# Patient Record
Sex: Female | Born: 1960 | Race: Black or African American | Hispanic: No | Marital: Married | Smoking: Never smoker
Health system: Southern US, Community
[De-identification: ages and names within clinical notes are randomized; demographics above are authoritative.]

## PROBLEM LIST (undated history)

## (undated) DIAGNOSIS — K319 Disease of stomach and duodenum, unspecified: Secondary | ICD-10-CM

---

## 2009-05-18 ENCOUNTER — Emergency Department (HOSPITAL_COMMUNITY): Admission: EM | Admit: 2009-05-18 | Discharge: 2009-05-18 | Payer: Self-pay | Admitting: Family Medicine

## 2009-05-27 ENCOUNTER — Emergency Department (HOSPITAL_COMMUNITY): Admission: EM | Admit: 2009-05-27 | Discharge: 2009-05-28 | Payer: Self-pay | Admitting: Emergency Medicine

## 2009-06-01 ENCOUNTER — Ambulatory Visit: Payer: Self-pay | Admitting: Obstetrics and Gynecology

## 2009-07-04 ENCOUNTER — Ambulatory Visit (HOSPITAL_COMMUNITY): Admission: RE | Admit: 2009-07-04 | Discharge: 2009-07-04 | Payer: Self-pay | Admitting: Family Medicine

## 2009-07-29 ENCOUNTER — Encounter: Admission: RE | Admit: 2009-07-29 | Discharge: 2009-07-29 | Payer: Self-pay | Admitting: Internal Medicine

## 2009-08-04 ENCOUNTER — Ambulatory Visit: Payer: Self-pay | Admitting: Obstetrics and Gynecology

## 2009-08-05 ENCOUNTER — Ambulatory Visit (HOSPITAL_COMMUNITY): Admission: RE | Admit: 2009-08-05 | Discharge: 2009-08-05 | Payer: Self-pay | Admitting: Obstetrics & Gynecology

## 2009-08-18 ENCOUNTER — Ambulatory Visit: Payer: Self-pay | Admitting: Obstetrics and Gynecology

## 2010-03-20 ENCOUNTER — Emergency Department (HOSPITAL_COMMUNITY): Admission: EM | Admit: 2010-03-20 | Discharge: 2010-03-20 | Payer: Self-pay | Admitting: Emergency Medicine

## 2010-07-11 LAB — DIFFERENTIAL
Basophils Absolute: 0 10*3/uL (ref 0.0–0.1)
Eosinophils Relative: 5 % (ref 0–5)
Lymphocytes Relative: 50 % — ABNORMAL HIGH (ref 12–46)
Lymphs Abs: 1.7 10*3/uL (ref 0.7–4.0)
Monocytes Absolute: 0.2 10*3/uL (ref 0.1–1.0)

## 2010-07-11 LAB — CBC
Hemoglobin: 14 g/dL (ref 12.0–15.0)
MCH: 30.7 pg (ref 26.0–34.0)
MCV: 90.8 fL (ref 78.0–100.0)
RBC: 4.56 MIL/uL (ref 3.87–5.11)
RDW: 13.6 % (ref 11.5–15.5)

## 2010-07-11 LAB — POCT I-STAT, CHEM 8
BUN: 9 mg/dL (ref 6–23)
Calcium, Ion: 1.2 mmol/L (ref 1.12–1.32)
Creatinine, Ser: 0.9 mg/dL (ref 0.4–1.2)
TCO2: 27 mmol/L (ref 0–100)

## 2010-07-11 LAB — POCT CARDIAC MARKERS
CKMB, poc: <1 ng/mL — ABNORMAL LOW (ref 1.0–8.0)
Troponin i, poc: <0.05 ng/mL (ref 0.00–0.09)

## 2010-07-11 LAB — BASIC METABOLIC PANEL
Calcium: 9.1 mg/dL (ref 8.4–10.5)
Creatinine, Ser: 0.77 mg/dL (ref 0.4–1.2)
GFR calc Af Amer: >60 mL/min (ref >60)
GFR calc non Af Amer: >60 mL/min (ref >60)
Glucose, Bld: 93 mg/dL (ref 70–99)
Sodium: 141 mEq/L (ref 135–145)

## 2010-07-11 LAB — D-DIMER, QUANTITATIVE: D-Dimer, Quant: 0.35 ug/mL-FEU (ref 0.00–0.48)

## 2010-07-11 IMAGING — US US TRANSVAGINAL NON-OB
1 series · 14 of 25 positions shown · non-contrast
Comparison: 07/04/2009

CLINICAL DATA: Severe pelvic pain.  History of ovarian cysts.

TRANSVAGINAL ULTRASOUND OF PELVIS
TECHNIQUE: Transvaginal ultrasound examination of the pelvis was
performed including evaluation of the uterus, ovaries, adnexal
regions, and pelvic cul-de-sac.

[Series 1: us transvaginal non-ob · 0.11mm/px · 14 of 31 slices shown]
[im 1/31]
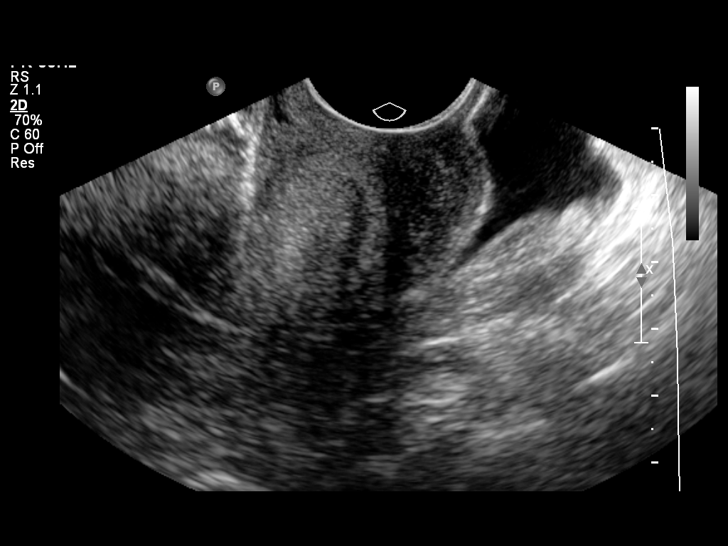
[im 3/31]
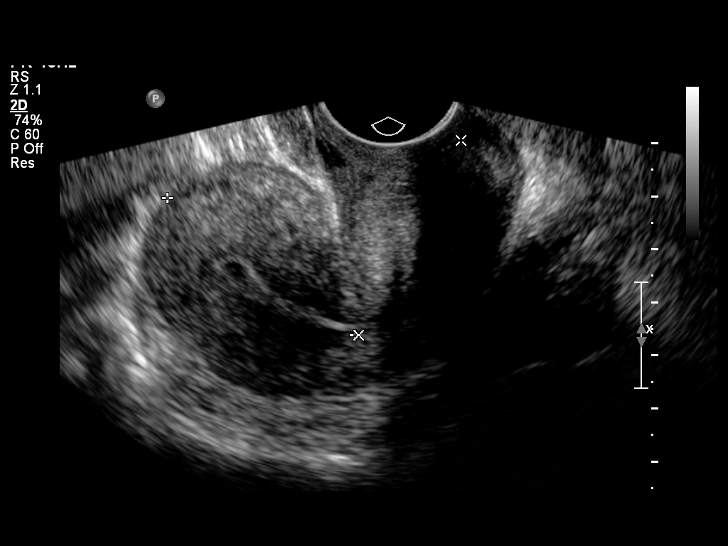
[im 6/31]
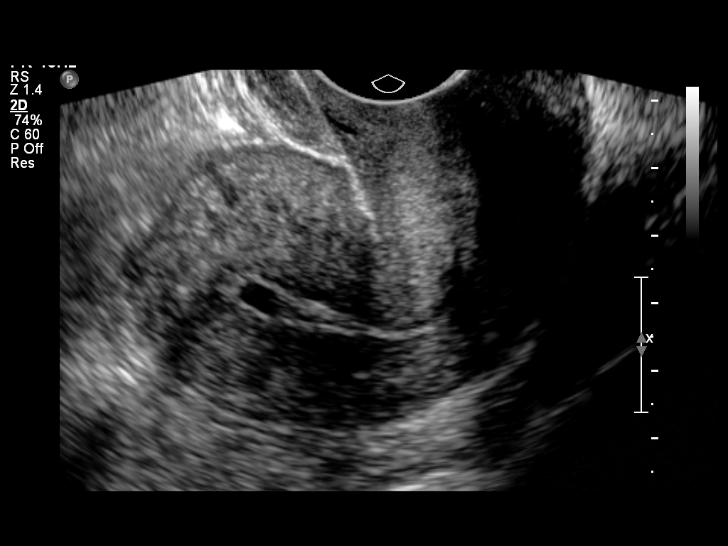
[im 8/31]
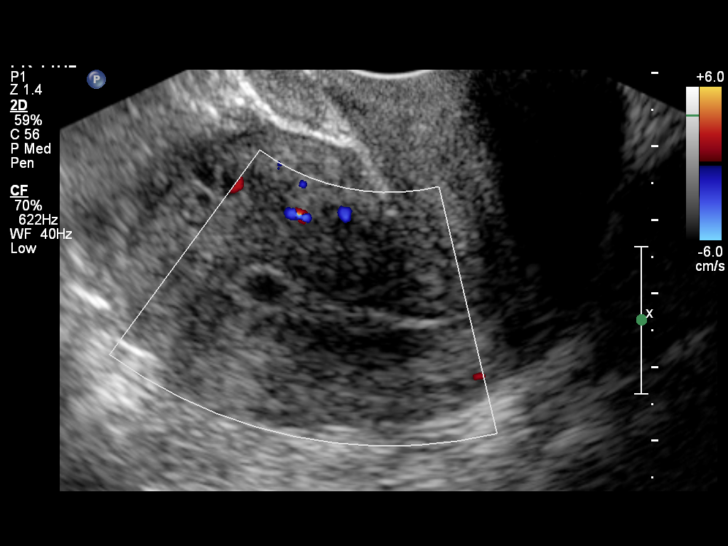
[im 11/31]
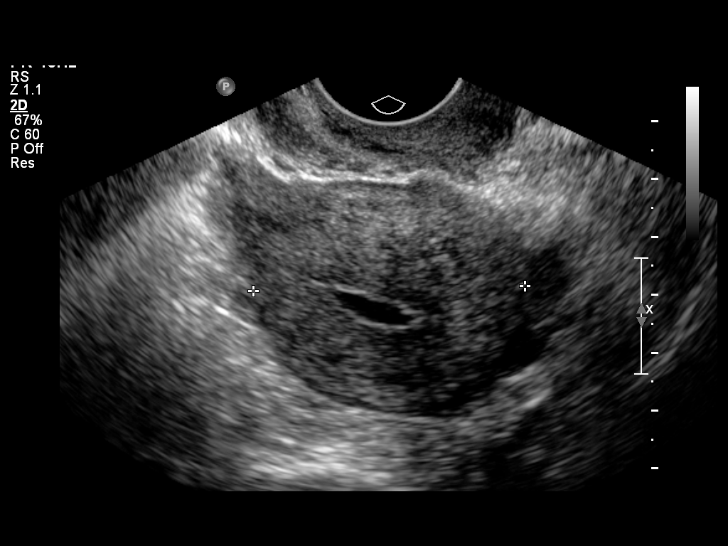
[im 12/31]
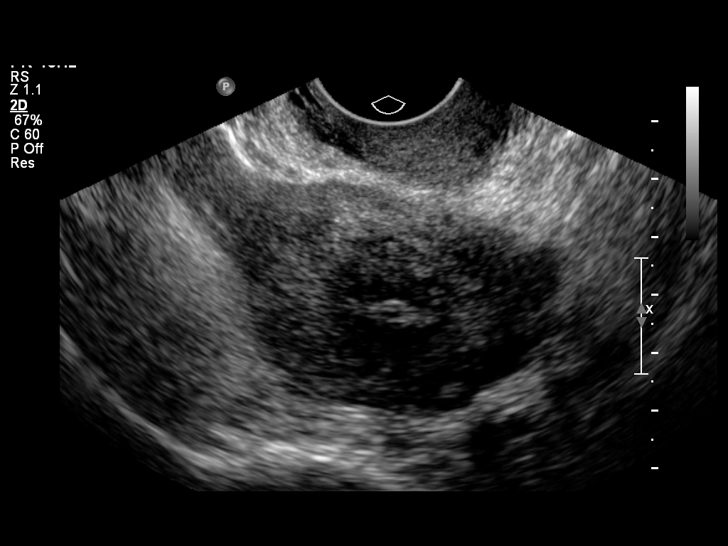
[im 14/31]
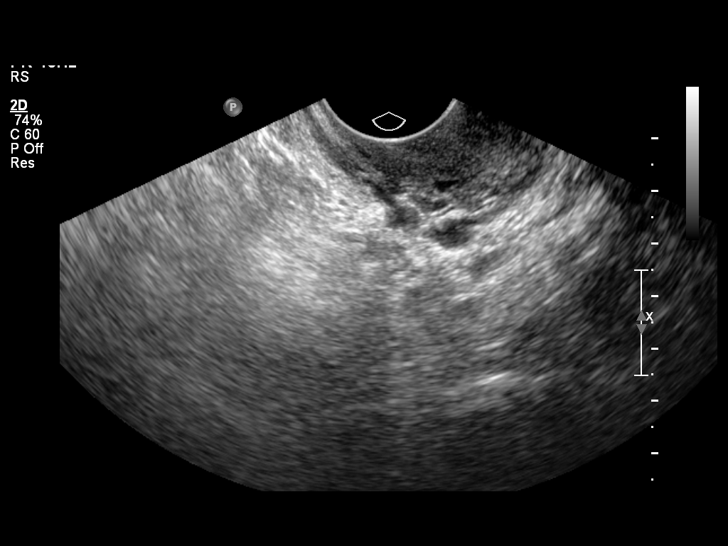
[im 17/31]
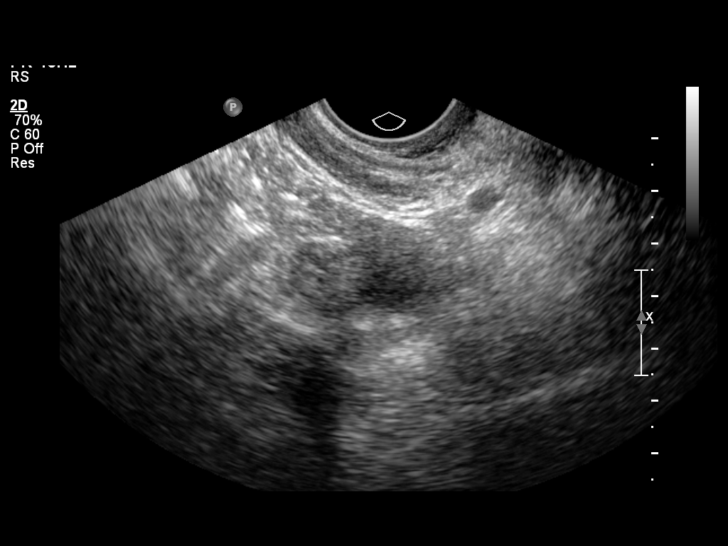
[im 19/31]
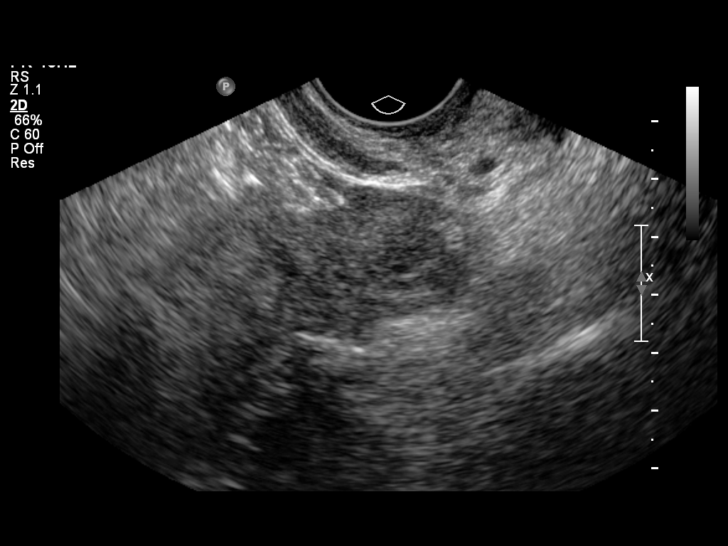
[im 21/31]
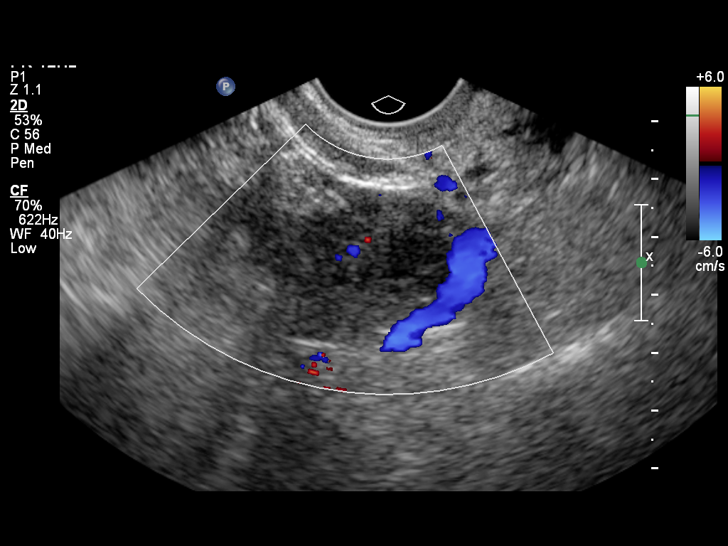
[im 23/31]
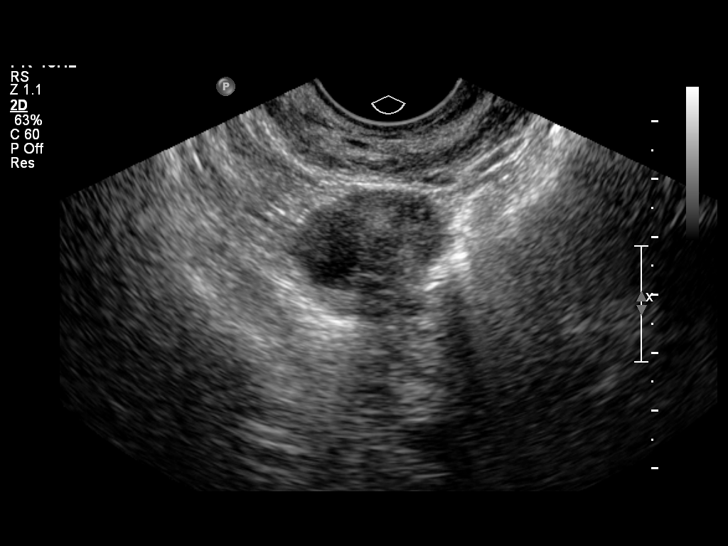
[im 26/31]
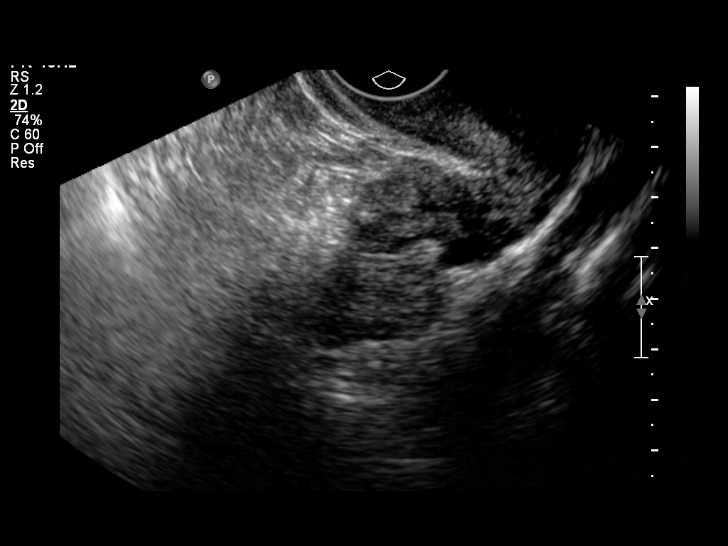
[im 28/31]
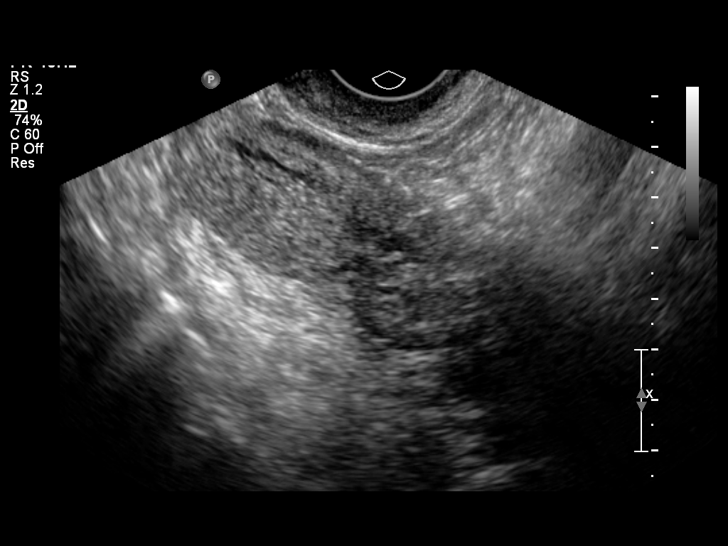
[im 31/31]
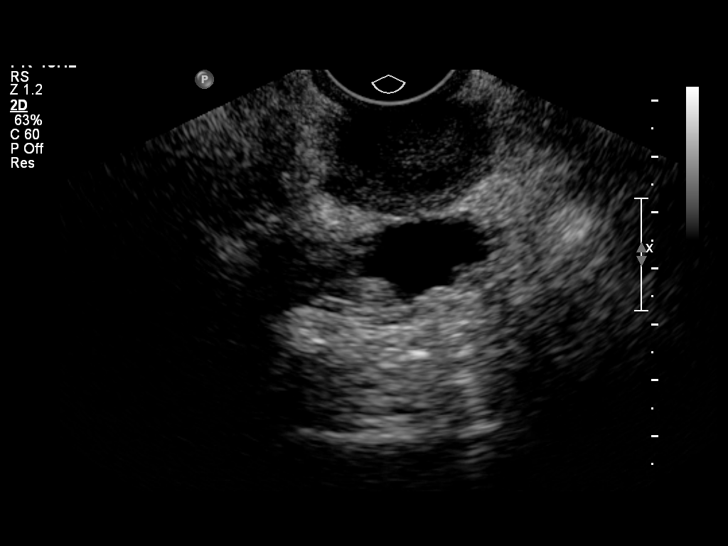

[14 of 25 positions shown; findings below may reference images not displayed]

FINDINGS: Uterus measures 8.5 x 3.8 x 4.7 cm.  No fibroids or other uterine
masses identified.

Endometrium measures 2 mm in thickness.  Tiny amount of fluid noted
in endometrial cavity.

Right Ovary measures 3.3 x 2.2 x 1.6 cm.  Normal appearance.

Left Ovary measures 2.7 x 1.5 x 1.2 cm.  Normal appearance.

Other Findings:  Tiny amount of free fluid in pelvic cul-de-sac.
IMPRESSION: No evidence of pelvic mass or other significant abnormality. Tiny
amount of free fluid in pelvic cul-de-sac is nonspecific, but
likely physiologic.

## 2010-07-17 LAB — POCT I-STAT, CHEM 8
BUN: 13 mg/dL (ref 6–23)
Creatinine, Ser: 0.8 mg/dL (ref 0.4–1.2)
Hemoglobin: 14.3 g/dL (ref 12.0–15.0)
Potassium: 3.5 mEq/L (ref 3.5–5.1)
Sodium: 141 mEq/L (ref 135–145)

## 2010-07-17 LAB — URINALYSIS, ROUTINE W REFLEX MICROSCOPIC
Bilirubin Urine: NEGATIVE
Ketones, ur: NEGATIVE mg/dL
Nitrite: NEGATIVE
Protein, ur: NEGATIVE mg/dL
Urobilinogen, UA: 0.2 mg/dL (ref 0.0–1.0)

## 2010-07-19 LAB — POCT URINALYSIS DIP (DEVICE)
Bilirubin Urine: NEGATIVE
Ketones, ur: 15 mg/dL — AB
pH: 5 (ref 5.0–8.0)

## 2010-07-20 LAB — POCT URINALYSIS DIP (DEVICE)
Ketones, ur: NEGATIVE mg/dL
Protein, ur: NEGATIVE mg/dL
Specific Gravity, Urine: <=1.005 (ref 1.005–1.030)

## 2011-11-02 ENCOUNTER — Other Ambulatory Visit: Payer: Self-pay | Admitting: Internal Medicine

## 2011-11-02 DIAGNOSIS — N6452 Nipple discharge: Secondary | ICD-10-CM

## 2011-11-07 ENCOUNTER — Other Ambulatory Visit: Payer: Self-pay | Admitting: Internal Medicine

## 2011-11-07 ENCOUNTER — Ambulatory Visit
Admission: RE | Admit: 2011-11-07 | Discharge: 2011-11-07 | Disposition: A | Discharge status: Home / Self Care | Payer: Self-pay | Source: Ambulatory Visit | Attending: Internal Medicine | Admitting: Internal Medicine

## 2011-11-07 ENCOUNTER — Ambulatory Visit
Admission: RE | Admit: 2011-11-07 | Discharge: 2011-11-07 | Disposition: A | Discharge status: Home / Self Care | Payer: No Typology Code available for payment source | Source: Ambulatory Visit | Attending: Internal Medicine | Admitting: Internal Medicine

## 2011-11-07 DIAGNOSIS — N6452 Nipple discharge: Secondary | ICD-10-CM

## 2011-11-10 ENCOUNTER — Emergency Department (HOSPITAL_COMMUNITY)
Admission: EM | Admit: 2011-11-10 | Discharge: 2011-11-10 | Disposition: A | Discharge status: Home / Self Care | Payer: Medicaid Other | Attending: Emergency Medicine | Admitting: Emergency Medicine

## 2011-11-10 ENCOUNTER — Emergency Department (HOSPITAL_COMMUNITY): Payer: Medicaid Other

## 2011-11-10 ENCOUNTER — Encounter (HOSPITAL_COMMUNITY): Payer: Self-pay | Admitting: *Deleted

## 2011-11-10 DIAGNOSIS — R110 Nausea: Secondary | ICD-10-CM | POA: Insufficient documentation

## 2011-11-10 DIAGNOSIS — K2990 Gastroduodenitis, unspecified, without bleeding: Secondary | ICD-10-CM | POA: Insufficient documentation

## 2011-11-10 DIAGNOSIS — R079 Chest pain, unspecified: Secondary | ICD-10-CM | POA: Insufficient documentation

## 2011-11-10 DIAGNOSIS — K2970 Gastritis, unspecified, without bleeding: Secondary | ICD-10-CM | POA: Insufficient documentation

## 2011-11-10 DIAGNOSIS — R109 Unspecified abdominal pain: Secondary | ICD-10-CM

## 2011-11-10 LAB — CBC WITH DIFFERENTIAL/PLATELET
Basophils Absolute: 0 10*3/uL (ref 0.0–0.1)
Basophils Relative: 1 % (ref 0–1)
Eosinophils Relative: 8 % — ABNORMAL HIGH (ref 0–5)
Lymphocytes Relative: 39 % (ref 12–46)
MCHC: 34.4 g/dL (ref 30.0–36.0)
MCV: 90.2 fL (ref 78.0–100.0)
Platelets: 112 10*3/uL — ABNORMAL LOW (ref 150–400)
RDW: 12.8 % (ref 11.5–15.5)
WBC: 3.8 10*3/uL — ABNORMAL LOW (ref 4.0–10.5)

## 2011-11-10 LAB — URINALYSIS, ROUTINE W REFLEX MICROSCOPIC
Glucose, UA: NEGATIVE mg/dL
Leukocytes, UA: NEGATIVE
Specific Gravity, Urine: 1.006 (ref 1.005–1.030)
pH: 6 (ref 5.0–8.0)

## 2011-11-10 LAB — COMPREHENSIVE METABOLIC PANEL
ALT: 7 U/L (ref 0–35)
AST: 13 U/L (ref 0–37)
Albumin: 3.4 g/dL — ABNORMAL LOW (ref 3.5–5.2)
CO2: 23 mEq/L (ref 19–32)
Calcium: 8.8 mg/dL (ref 8.4–10.5)
GFR calc non Af Amer: >90 mL/min (ref >90)
Sodium: 138 mEq/L (ref 135–145)
Total Protein: 6.6 g/dL (ref 6.0–8.3)

## 2011-11-10 LAB — POCT PREGNANCY, URINE: Preg Test, Ur: NEGATIVE

## 2011-11-10 LAB — URINE MICROSCOPIC-ADD ON

## 2011-11-10 LAB — PREGNANCY, URINE: Preg Test, Ur: NEGATIVE

## 2011-11-10 MED ORDER — IOHEXOL 300 MG/ML  SOLN: 20.0000 mL | INTRAMUSCULAR | Status: AC

## 2011-11-10 MED ORDER — IOHEXOL 300 MG/ML  SOLN
100.0000 mL | Freq: Once | INTRAMUSCULAR | Status: AC | PRN
  Administered 2011-11-10: 100 mL via INTRAVENOUS

## 2011-11-10 MED ORDER — HYDROCODONE-ACETAMINOPHEN 5-500 MG PO TABS: 1.0000 | ORAL_TABLET | Freq: Four times a day (QID) | ORAL | Status: AC | PRN

## 2011-11-10 MED ORDER — OMEPRAZOLE 20 MG PO CPDR: 20.0000 mg | DELAYED_RELEASE_CAPSULE | Freq: Two times a day (BID) | ORAL | Status: DC

## 2011-11-10 MED ORDER — SODIUM CHLORIDE 0.9 % IV SOLN
Freq: Once | INTRAVENOUS | Status: AC
  Administered 2011-11-10: 09:00:00 via INTRAVENOUS

## 2011-11-10 NOTE — ED Notes (Signed)
To ED for eval of lower abd pain that radiates around to back and up torso. Pain started yesterday. Pt vomits after eating. Skin w/d, resp e/u. abd soft. No diarrhea.

## 2011-11-10 NOTE — ED Provider Notes (Signed)
History     CSN: [id number]  Arrival date & time 11/10/11  7829   First MD Initiated Contact with Patient 11/10/11 [phone]      Chief Complaint  Patient presents with  . Abdominal Pain    (Consider location/radiation/quality/duration/timing/severity/associated sxs/prior treatment) HPI Comments: Patient speaks minimal English.  Native language is [place] Swahili and history is taken through son and husband who have acted as Equities trader.  A Level 5 caveat therefore applies.  She reports to me she has had two days of discomfort that started in the lower abdomen and has spread to the upper abd and chest.  She was recently treated with antibiotics for a discharge from her breast.  She had an ultrasound at [facility]' that was okay.    Patient is a 51 y.o. female presenting with abdominal pain. The history is provided by the patient.  Abdominal Pain The primary symptoms of the illness include abdominal pain and nausea. The primary symptoms of the illness do not include fever, shortness of breath, vomiting, diarrhea or dysuria. The current episode started 2 days ago. The onset of the illness was gradual. The problem has been gradually worsening.  The illness is associated with eating. The patient states that she believes she is currently not pregnant. The patient has not had a change in bowel habit. Symptoms associated with the illness do not include chills, constipation or frequency.    History reviewed. No pertinent past medical history.  History reviewed. No pertinent past surgical history.  History reviewed. No pertinent family history.  History  Substance Use Topics  . Smoking status: Not on file  . Smokeless tobacco: Not on file  . Alcohol Use: Not on file    OB History    Grav Para Term Preterm Abortions TAB SAB Ect Mult Living                  Review of Systems  Constitutional: Negative for fever and chills.  Respiratory: Negative for shortness of breath.   Gastrointestinal:  Positive for nausea and abdominal pain. Negative for vomiting, diarrhea and constipation.  Genitourinary: Negative for dysuria and frequency.  All other systems reviewed and are negative.    Allergies  Review of patient's allergies indicates no known allergies.  Home Medications  No current outpatient prescriptions on file.  BP 126/83  Pulse 72  Temp 98.3 F (36.8 C) (Oral)  Resp 20  SpO2 98%  Physical Exam  Nursing note and vitals reviewed. Constitutional: She is oriented to person, place, and time. She appears well-developed and well-nourished. No distress.  HENT:  Head: Normocephalic and atraumatic.  Neck: Normal range of motion. Neck supple.  Cardiovascular: Normal rate and regular rhythm.  Exam reveals no gallop and no friction rub.   No murmur heard. Pulmonary/Chest: Effort normal and breath sounds normal. No respiratory distress. She has no wheezes.  Abdominal: Soft. Bowel sounds are normal. She exhibits no distension.       There is mild ttp in all four quadrants, but no rebound or guarding.  The worst seems to be in the ruq and epigastrium.    Genitourinary:       There is no cva ttp.  Musculoskeletal: Normal range of motion.  Neurological: She is alert and oriented to person, place, and time.  Skin: Skin is warm and dry. She is not diaphoretic.    ED Course  Procedures (including critical care time)   Labs Reviewed  CBC WITH DIFFERENTIAL  COMPREHENSIVE METABOLIC PANEL  LIPASE, BLOOD  URINALYSIS, ROUTINE W REFLEX MICROSCOPIC  PREGNANCY, URINE   No results found.   No diagnosis found.    MDM  The results of the workup show a normal wbc, electrolytes, and urine.  A ct was obtained which reveals only stomach wall thickening, consistent with gastritis.  This is consistent with the location and nature of her symptoms, and nothing else has turned up on the ct to explain.  She will be discharged to home with prilosec and pain medications.  To return or follow  up prn.            Geoffery Lyons, MD 11/10/11 1215

## 2011-12-06 ENCOUNTER — Ambulatory Visit (INDEPENDENT_AMBULATORY_CARE_PROVIDER_SITE_OTHER): Payer: Self-pay | Admitting: Surgery

## 2011-12-24 ENCOUNTER — Encounter (INDEPENDENT_AMBULATORY_CARE_PROVIDER_SITE_OTHER): Payer: Self-pay | Admitting: Surgery

## 2011-12-24 ENCOUNTER — Ambulatory Visit (INDEPENDENT_AMBULATORY_CARE_PROVIDER_SITE_OTHER): Payer: Self-pay | Admitting: Surgery

## 2011-12-24 VITALS — BP 130/80 | HR 62 | Temp 97.8°F | Ht 65.0 in | Wt 173.0 lb

## 2011-12-24 DIAGNOSIS — N6452 Nipple discharge: Secondary | ICD-10-CM | POA: Insufficient documentation

## 2011-12-24 DIAGNOSIS — N6459 Other signs and symptoms in breast: Secondary | ICD-10-CM

## 2011-12-24 NOTE — Progress Notes (Signed)
Subjective:     Patient ID: Nicole Whitney, female   DOB: 1960-06-18, 51 y.o.   MRN: [id number]  HPI She is referred by Dr. Si Gaul for right nipple discharge. She had a recent ductogram demonstrating possible papillomas in 2 separate areas. She does not take any English and is a copy by her husband who is translating. She has no previous history of breast surgery. She is otherwise healthy  Review of Systems     Objective:   Physical Exam    On physical examination, there are no palpable masses in either breast. There is no axillary adenopathy. I cannot demonstrate any nipple discharge today.  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 I discussed this with the patient through her husband. I discussed the risks of surgery which includes but is not limited to bleeding, infection, need for further surgery should malignancy be present, et Karie Soda. She wishes to proceed

## 2012-01-15 ENCOUNTER — Ambulatory Visit (HOSPITAL_COMMUNITY): Admission: RE | Admit: 2012-01-15 | Payer: Self-pay | Source: Ambulatory Visit | Admitting: Surgery

## 2012-01-15 ENCOUNTER — Encounter (HOSPITAL_COMMUNITY): Admission: RE | Payer: Self-pay | Source: Ambulatory Visit

## 2012-01-15 SURGERY — EXCISION OF BREAST BIOPSY
Anesthesia: General | Laterality: Right

## 2012-01-29 ENCOUNTER — Encounter (INDEPENDENT_AMBULATORY_CARE_PROVIDER_SITE_OTHER): Payer: Self-pay | Admitting: Surgery

## 2012-01-31 ENCOUNTER — Encounter (INDEPENDENT_AMBULATORY_CARE_PROVIDER_SITE_OTHER): Payer: Self-pay | Admitting: Surgery

## 2013-02-12 ENCOUNTER — Emergency Department (HOSPITAL_COMMUNITY): Payer: Worker's Compensation

## 2013-02-12 ENCOUNTER — Emergency Department (HOSPITAL_COMMUNITY)
Admission: EM | Admit: 2013-02-12 | Discharge: 2013-02-12 | Disposition: A | Discharge status: Home / Self Care | Payer: Worker's Compensation | Attending: Emergency Medicine | Admitting: Emergency Medicine

## 2013-02-12 ENCOUNTER — Encounter (HOSPITAL_COMMUNITY): Payer: Self-pay | Admitting: Emergency Medicine

## 2013-02-12 DIAGNOSIS — S8001XA Contusion of right knee, initial encounter: Secondary | ICD-10-CM

## 2013-02-12 DIAGNOSIS — S161XXA Strain of muscle, fascia and tendon at neck level, initial encounter: Secondary | ICD-10-CM

## 2013-02-12 DIAGNOSIS — Y939 Activity, unspecified: Secondary | ICD-10-CM | POA: Insufficient documentation

## 2013-02-12 DIAGNOSIS — Y990 Civilian activity done for income or pay: Secondary | ICD-10-CM | POA: Insufficient documentation

## 2013-02-12 DIAGNOSIS — S0093XA Contusion of unspecified part of head, initial encounter: Secondary | ICD-10-CM

## 2013-02-12 DIAGNOSIS — S139XXA Sprain of joints and ligaments of unspecified parts of neck, initial encounter: Secondary | ICD-10-CM | POA: Insufficient documentation

## 2013-02-12 DIAGNOSIS — S298XXA Other specified injuries of thorax, initial encounter: Secondary | ICD-10-CM | POA: Insufficient documentation

## 2013-02-12 DIAGNOSIS — R110 Nausea: Secondary | ICD-10-CM | POA: Insufficient documentation

## 2013-02-12 DIAGNOSIS — S8000XA Contusion of unspecified knee, initial encounter: Secondary | ICD-10-CM | POA: Insufficient documentation

## 2013-02-12 DIAGNOSIS — R404 Transient alteration of awareness: Secondary | ICD-10-CM | POA: Insufficient documentation

## 2013-02-12 DIAGNOSIS — S0003XA Contusion of scalp, initial encounter: Secondary | ICD-10-CM | POA: Insufficient documentation

## 2013-02-12 DIAGNOSIS — Z79899 Other long term (current) drug therapy: Secondary | ICD-10-CM | POA: Insufficient documentation

## 2013-02-12 DIAGNOSIS — IMO0002 Reserved for concepts with insufficient information to code with codable children: Secondary | ICD-10-CM | POA: Insufficient documentation

## 2013-02-12 DIAGNOSIS — Z8719 Personal history of other diseases of the digestive system: Secondary | ICD-10-CM | POA: Insufficient documentation

## 2013-02-12 DIAGNOSIS — Y929 Unspecified place or not applicable: Secondary | ICD-10-CM | POA: Insufficient documentation

## 2013-02-12 HISTORY — DX: Disease of stomach and duodenum, unspecified: K31.9

## 2013-02-12 NOTE — ED Notes (Signed)
Pt presents from work via [employer] with c/o a 50lbs box falling onto her head and forcing her head to hit a table. Pt also reports having abdominal pain earlier today prior to the incident and c/o CP upon arrival to the ED. Pt calm, alert, interactive. Pt speaks Swahili, interpreter phone being used for translation

## 2013-02-12 NOTE — ED Provider Notes (Signed)
CSN: [id number]     Arrival date & time 02/12/13  1409 History   First MD Initiated Contact with Patient 02/12/13 1427     Chief Complaint  Patient presents with  . Chest Pain  . Head Injury   (Consider location/radiation/quality/duration/timing/severity/associated sxs/prior Treatment) Patient is a 52 y.o. female presenting with head injury. The history is provided by the patient. No language interpreter was used.  Head Injury Location:  Frontal and occipital Time since incident:  1 hour Mechanism of injury: direct blow   Pain details:    Quality:  Aching   Severity:  Moderate   Duration:  1 hour   Timing:  Constant Chronicity:  New Relieved by:  Nothing Worsened by:  Nothing tried Ineffective treatments:  None tried Associated symptoms: headache, loss of consciousness and nausea   Associated symptoms: no blurred vision   Risk factors: no alcohol use   Pt reports a 50 pd box fell off a stack of boxes hitting her in the head.  Pt reports back of head was hit by box,  Front of head hit table in front of her.  Pt unsure if she completely blacked out.  Pt complains of neck pain and right knee pain  Past Medical History  Diagnosis Date  . Stomach problems    History reviewed. No pertinent past surgical history. No family history on file. History  Substance Use Topics  . Smoking status: Never Smoker   . Smokeless tobacco: Never Used  . Alcohol Use: No   OB History   Grav Para Term Preterm Abortions TAB SAB Ect Mult Living                 Review of Systems  Eyes: Negative for blurred vision.  Gastrointestinal: Positive for nausea.  Neurological: Positive for loss of consciousness and headaches.  All other systems reviewed and are negative.    Allergies  Review of patient's allergies indicates no known allergies.  Home Medications   Current Outpatient Rx  Name  Route  Sig  Dispense  Refill  . ibuprofen (ADVIL,MOTRIN) 200 MG tablet   Oral   Take 200 mg by mouth  every 6 (six) hours as needed. pain         . omeprazole (PRILOSEC) 40 MG capsule   Oral   Take 40 mg by mouth daily.         Marland Kitchen EXPIRED: omeprazole (PRILOSEC) 20 MG capsule   Oral   Take 1 capsule (20 mg total) by mouth 2 (two) times daily.   30 capsule   1    BP 115/75  Pulse 78  Temp(Src) 98.6 F (37 C)  Resp 17  Wt 180 lb (81.647 kg)  BMI 29.95 kg/m2  SpO2 96%  LMP 01/18/2013 Physical Exam  Nursing note and vitals reviewed. Constitutional: She is oriented to person, place, and time. She appears well-developed and well-nourished.  HENT:  Head: Normocephalic and atraumatic.  Right Ear: External ear normal.  Left Ear: External ear normal.  Nose: Nose normal.  Mouth/Throat: Oropharynx is clear and moist.  Eyes: Conjunctivae are normal. Pupils are equal, round, and reactive to light.  Neck: Normal range of motion. Neck supple.  Cardiovascular: Normal rate.   Pulmonary/Chest: Effort normal.  Abdominal: Bowel sounds are normal.  Musculoskeletal: Normal range of motion.  Neurological: She is alert and oriented to person, place, and time.  Skin: Skin is warm.  Psychiatric: She has a normal mood and affect.    ED  Course  Procedures (including critical care time) Labs Review Labs Reviewed - No data to display Imaging Review Dg Cervical Spine Complete  02/12/2013   CLINICAL DATA:  Injury with a blow to the top of the head.  EXAM: CERVICAL SPINE  4+ VIEWS  COMPARISON:  None.  FINDINGS: There is no evidence of cervical spine fracture or prevertebral soft tissue swelling. Alignment is normal. No other significant bone abnormalities are identified.  IMPRESSION: Negative cervical spine radiographs.   Electronically Signed   By: Drusilla Kanner M.D.   On: 02/12/2013 16:05   Ct Head Wo Contrast  02/12/2013   CLINICAL DATA:  Blunt trauma to the head, chest pain  EXAM: CT HEAD WITHOUT CONTRAST  TECHNIQUE: Contiguous axial images were obtained from the base of the skull through  the vertex without intravenous contrast.  COMPARISON:  Head CT 03/20/2010  FINDINGS: No intracranial hemorrhage. No parenchymal contusion. No midline shift or mass effect. Basilar cisterns are patent. No skull base fracture. No fluid in the paranasal sinuses or mastoid air cells.  IMPRESSION: No intracranial trauma.  Normal head CT.   Electronically Signed   By: Genevive Bi M.D.   On: 02/12/2013 15:33   Dg Knee Complete 4 Views Right  02/12/2013   CLINICAL DATA:  Recent injury with knee pain  EXAM: RIGHT KNEE - COMPLETE 4+ VIEW  COMPARISON:  None.  FINDINGS: Mild joint space narrowing is noted medially with osteophyte formation. No acute fracture or dislocation is seen. No joint effusion is noted.  IMPRESSION: Degenerative change without acute abnormality.   Electronically Signed   By: Alcide Clever M.D.   On: 02/12/2013 16:03    EKG Interpretation   None      Date: 02/12/2013  Rate: 81  Rhythm: normal sinus rhythm  QRS Axis: normal  Intervals: normal  ST/T Wave abnormalities: normal  Conduction Disutrbances:none  Narrative Interpretation:   Old EKG Reviewed: none available   MDM   1. Contusion of head, initial encounter    Ct head, c spine and right knee no fracture.   Pt advised tylenol every 4 hours.       Lonia Skinner [place], PA-C 02/12/13 1659

## 2013-02-12 NOTE — ED Notes (Signed)
Pt returned from Radiology.

## 2013-02-12 NOTE — ED Notes (Signed)
Pt ambulated around unit x 1 with no assistance.  Pt denied any dizziness nor had unsteady gait.

## 2013-02-12 NOTE — ED Notes (Signed)
Pt remains in radiology at this time. Husband in tx room

## 2013-02-12 NOTE — ED Provider Notes (Signed)
Medical screening examination/treatment/procedure(s) were performed by non-physician practitioner and as supervising physician I was immediately available for consultation/collaboration.   David H Yao, MD 02/12/13 2015 

## 2014-06-02 ENCOUNTER — Ambulatory Visit: Payer: Self-pay

## 2014-06-02 ENCOUNTER — Other Ambulatory Visit: Payer: Self-pay | Admitting: Occupational Medicine

## 2014-06-02 DIAGNOSIS — R52 Pain, unspecified: Secondary | ICD-10-CM

## 2014-06-08 ENCOUNTER — Other Ambulatory Visit: Payer: Self-pay | Admitting: Occupational Medicine

## 2014-06-08 ENCOUNTER — Ambulatory Visit: Payer: Self-pay

## 2014-06-08 DIAGNOSIS — R52 Pain, unspecified: Secondary | ICD-10-CM

## 2014-06-29 ENCOUNTER — Ambulatory Visit: Payer: Worker's Compensation | Admitting: Physical Therapy

## 2014-07-05 ENCOUNTER — Encounter: Payer: Self-pay | Admitting: Physical Therapy

## 2014-07-05 ENCOUNTER — Ambulatory Visit: Payer: Worker's Compensation | Attending: Occupational Medicine | Admitting: Physical Therapy

## 2014-07-05 DIAGNOSIS — M549 Dorsalgia, unspecified: Secondary | ICD-10-CM | POA: Insufficient documentation

## 2014-07-05 NOTE — Patient Instructions (Addendum)
Angry Cat, All Fours   Kneel on hands and knees. Tuck chin and tighten stomach. Exhale and round back upward. Inhale and arch back downward. Hold each position _10__ seconds. Repeat _5__ times per session. Do __2_ sessions per day.  Copyright  VHI. All rights reserved.    Lumbar Side-Bend (All-Fours)   Tilt head and shoulder to right side, rotate same side hip toward head. Repeat __5-10__ times per set. Do __2__ sets per session. Do __2__ sessions per day.  http://orth.exer.us/244   Copyright  VHI. All rights reserved.       Flexion   Sitting on knees, fold body over legs and relax head and arms on floor. Extend arms as far forward as possible. Hold __30__ seconds. Repeat __2-3__ times. Do __2__ sessions per day. Then bring hands to Rt, repeat 1-2 times.  Then bring hands to Lt., repeat 1-2 times.  Copyright  VHI. All rights reserved.    Reducing Load   Copyright  VHI. All rights reserved.  BODY MECHANICS Tips Good body mechanics are important during activities of daily living. The practice of good body mechanics will: -help distribute weight throughout the skeleton in a more anatomically correct manner thus stimulating more normal forces on the bones, and encouraging stronger, healthier, denser bones. -reduce unnatural forces on bones, ligaments, joints and muscles and reduce risk of fracture, other injury or back pain. A WORD ON BODY POSITIONING: Sitting is the hardest position for the back. Lying on the back is the easiest. Standing, in good body alignment, is somewhere between. A good motto is: Sit less, stand more, and, when you can't do that, lie down on your back and exercise to strengthen it.  Copyright  VHI. All rights reserved.       Supine to Sit (Active)   Lie on back, left leg bent. Roll to other side. From side-lying, sit up on side of bed. Complete __1_ sets of _2-3__ repetitions. Perform ___1-2 sessions per day.  Copyright  VHI. All rights  reserved.    Housework - Reaching Down   If you are unable to bend your knees or squat, use a lazy Darl PikesSusan to keep items within easy reach. Store only light, unbreakable items on the lowest shelves, and use a reacher to pick them up.  Copyright  VHI. All rights reserved.  Low Shelf   Squat down, and bring item close to lift.   Copyright  VHI. All rights reserved.  Lifting Principles .Maintain proper posture and head alignment. .Slide object as close as possible before lifting. .Move obstacles out of the way. .Test before lifting; ask for help if too heavy. .Tighten stomach muscles without holding breath. .Use smooth movements; do not jerk. .Use legs to do the work, and pivot with feet. .Distribute the work load symmetrically and close to the center of trunk. .Push instead of pull whenever possible.  Copyright  VHI. All rights reserved.  Posture - Standing   Good posture is important. Avoid slouching and forward head thrust. Maintain curve in low back and align ears over shoul- ders, hips over ankles.   Copyright  VHI. All rights reserved.   Posture - Sitting   Sit upright, head facing forward. Try using a roll to support lower back. Keep shoulders relaxed, and avoid rounded back. Keep hips level with knees. Avoid crossing legs for long periods.   Copyright  VHI. All rights reserved.  Ideal Posture Use with figures on 3 (2 of 2): 1.Head erect 2.Chin in 3.Chest and  navel aligned 4.Spinal curves maintained 5.Knees relaxed 6.Shoulders and hips aligned 7.Feet slightly apart 8.Toes and arches active 9.Abdomen taut (breathe with diaphragm) 10.Arms at sides Ideal posture is: -pain free. -achieved with practice, mindful interest, and body awareness.  Copyright  VHI. All rights reserved.     Move heavy items one at a time, or move portions of the contents.   Posture Awareness     Stand and check posture: Jut chin, pull back to comfortable position. Tilt pelvis  forward, back; be sure back is not swayed. Roll from heels to balls of feet, then distribute your weight evenly. Picture a line through spine pulling you erect. Focus on breathing. Good Posture = Better Breathing. Check __3-5__ times per day.  http://gt2.exer.us/873   Copyright  VHI. All rights reserved.

## 2014-07-05 NOTE — Therapy (Signed)
[facility] [address] [place], [place], [address] Phone: [phone]   Fax:  [phone]  Physical Therapy Evaluation  Patient Details  Name: Nicole Whitney MRN: [id number] Date of Birth: Jul 17, 1960 Referring Provider:  Logan Bores, MD  Encounter Date: 07/05/2014      PT End of Session - 07/05/14 1330    Visit Number 1   Number of Visits 8  3 approved   Date for PT Re-Evaluation 08/16/14   PT Start Time 0800   PT Stop Time 0844   PT Time Calculation (min) 44 min   Activity Tolerance Patient tolerated treatment well      Past Medical History  Diagnosis Date  . Stomach problems     History reviewed. No pertinent past surgical history.  LMP 01/18/2013  Visit Diagnosis:  Left-sided back pain, unspecified location - Plan: PT plan of care cert/re-cert      Subjective Assessment - 07/05/14 0805    Symptoms Pt presents following an injury at work, sustained a back, neck injury on 06/24/14 (approx).  She has continued to work after 2 weeks off.  She has returned to work as a Neurosurgeon.  S   Limitations Sitting;Lifting;Standing;Walking;House hold activities   How long can you sit comfortably? 15 min   How long can you stand comfortably? 15 min    How long can you walk comfortably? walking helps a bit    Diagnostic tests XR neg   Patient Stated Goals have less pain    Currently in Pain? Yes   Pain Score 7    Pain Location Back   Pain Orientation Left;Posterior;Mid;Lower;Upper   Pain Descriptors / Indicators Sore;Tiring;Pressure   Pain Type Acute pain   Pain Radiating Towards Arms at times, feel tired   Pain Onset 1 to 4 weeks ago   Pain Frequency Intermittent   Aggravating Factors  prolonged sitting, standing   Pain Relieving Factors medicine, heat/ice/massage   Multiple Pain Sites No          [facility] PT Assessment - 07/05/14 0811    Assessment   Medical Diagnosis neck and low back pain    Onset Date  06/24/14   Precautions   Precautions None   Restrictions   Weight Bearing Restrictions No   Balance Screen   Has the patient fallen in the past 6 months Yes   How many times? 1 for accident   Has the patient had a decrease in activity level because of a fear of falling?  No   Is the patient reluctant to leave their home because of a fear of falling?  No   Prior Function   Level of Independence Independent with basic ADLs;Independent with homemaking with ambulation   Cognition   Overall Cognitive Status Within Functional Limits for tasks assessed   Sensation   Light Touch Appears Intact   Posture/Postural Control   Posture/Postural Control No significant limitations   AROM   AROM Assessment Site --  pain with all lumbar motions    Cervical Flexion WNL   Cervical Extension WNL   Cervical - Right Side Bend WNL   Cervical - Left Side Bend WNL   Cervical - Right Rotation WNL   Cervical - Left Rotation WNL   Lumbar Flexion WNL   Lumbar Extension WNL   Lumbar - Right Side Bend WNL   Lumbar - Left Side Bend WNL   Lumbar - Right Rotation 25%   Lumbar - Left Rotation 25%  Strength   Right/Left Shoulder --  WNL   Right/Left Hip --  WNL   Right/Left Knee --  WNL   Palpation   Palpation sore with all palpation to L. side of back, periscapular and into SI joint                          PT Education - 07/05/14 0836    Education provided Yes   Education Details PT/POC, HEP   Person(s) Educated Patient   Methods Explanation;Demonstration;Handout   Comprehension Verbalized understanding;Returned demonstration;Need further instruction             PT Long Term Goals - 07/05/14 1415    PT LONG TERM GOAL #1   Title Pt will be I with HEP for trunk, back   Time 4   Period Weeks   Status New   PT LONG TERM GOAL #2   Title Pt will be able to sit as needed at work with min increase in pain for 30 min at a time   Time 4   Period Weeks   Status New   PT  LONG TERM GOAL #3   Title Pt will be I with principles of lifting, body mechanics to avoid re-injury.    Time 4   Period Weeks   Status New   PT LONG TERM GOAL #4   Title Pt will be able to do normal ADLs and activities in the home with min pain overall   Time 4   Status New                Problem List Patient Active Problem List   Diagnosis Date Noted  . Nipple discharge 12/24/2011    Roslynn Holte 07/05/2014, 3:17 PM  [facility] [address] [place], [place], [id number] Phone: [phone]   Fax:  [phone]

## 2014-07-14 ENCOUNTER — Ambulatory Visit: Payer: No Typology Code available for payment source | Attending: Internal Medicine

## 2014-07-14 DIAGNOSIS — M549 Dorsalgia, unspecified: Secondary | ICD-10-CM | POA: Insufficient documentation

## 2014-07-14 NOTE — Patient Instructions (Signed)
quadraped arm and leg lifts Pt do do 1x/day 5-10 reps hold 1-3 sec.   Also hip flexor stretch with knee to chest and leg o table 30 sec x2-3 2x/day. Also to practive with pole /stick hip hinge x3-5 reps 1-2 x/day. Also asked to google hip hinge for visuals

## 2014-07-14 NOTE — Therapy (Signed)
[facility] [address] [place], [place], [address] Phone: [phone]   Fax:  [phone]  Physical Therapy Treatment  Patient Details  Name: Nicole Whitney MRN: [id number] Date of Birth: 12/02/1960 Referring Provider:  Fleet ContrasAvbuere, Edwin, MD  Encounter Date: 07/14/2014      PT End of Session - 07/14/14 0704    Visit Number 2   Number of Visits 8   Date for PT Re-Evaluation 08/16/14   PT Start Time 0700   PT Stop Time 0800   PT Time Calculation (min) 60 min   Activity Tolerance Patient tolerated treatment well   Behavior During Therapy [facility] for tasks assessed/performed      Past Medical History  Diagnosis Date  . Stomach problems     No past surgical history on file.  There were no vitals filed for this visit.  Visit Diagnosis:  Left-sided back pain, unspecified location      Subjective Assessment - 07/14/14 0700    Symptoms She reports  in back and neck   Currently in Pain? Yes   Pain Score 7    Multiple Pain Sites No                       [facility] Adult PT Treatment/Exercise - 07/14/14 0706    Exercises   Exercises Lumbar;Knee/Hip   Lumbar Exercises: Aerobic   Stationary Bike Nustep L4 UE and LE  6 min   Lumbar Exercises: Seated   Other Seated Lumbar Exercises With use of pole good posture and hip hinge x5 also done standing   Lumbar Exercises: Quadruped   Madcat/Old Horse 10 reps   Single Arm Raise 5 reps   Straight Leg Raise 5 reps   Opposite Arm/Leg Raise Right arm/Left leg;Left arm/Right leg;5 reps   Modalities   Modalities Moist Heat   Moist Heat Therapy   Number Minutes Moist Heat 15 Minutes   Moist Heat Location --  back     Reviewded initial HEp and she needed cues for all but did correct post instruction           PT Education - 07/14/14 0750    Education provided Yes   Education Details HEP   Person(s) Educated Patient  interpreter   Methods  Explanation;Demonstration;Tactile cues;Verbal cues;Handout   Comprehension Returned demonstration             PT Long Term Goals - 07/05/14 1415    PT LONG TERM GOAL #1   Title Pt will be I with HEP for trunk, back   Time 4   Period Weeks   Status New   PT LONG TERM GOAL #2   Title Pt will be able to sit as needed at work with min increase in pain for 30 min at a time   Time 4   Period Weeks   Status New   PT LONG TERM GOAL #3   Title Pt will be I with principles of lifting, body mechanics to avoid re-injury.    Time 4   Period Weeks   Status New   PT LONG TERM GOAL #4   Title Pt will be able to do normal ADLs and activities in the home with min pain overall   Time 4   Status New               Plan - 07/14/14 0705    Clinical Impression Statement She displays no indication of pain level of  seven and to start she reported 10/10 pain.     Pt will benefit from skilled therapeutic intervention in order to improve on the following deficits Pain   Rehab Potential Fair   PT Frequency 2x / week   PT Duration 4 weeks   PT Treatment/Interventions Therapeutic activities;Patient/family education;Manual techniques;Electrical Stimulation;Moist Heat;Therapeutic exercise   PT Next Visit Plan Body mech and exercies, stretching   PT Home Exercise Plan stetching   Consulted and Agree with Plan of Care Patient        Problem List Patient Active Problem List   Diagnosis Date Noted  . Nipple discharge 12/24/2011    Caprice Red PT 07/14/2014, 7:52 AM  [facility] [address] [place], [place], [address] Phone: [phone]   Fax:  [phone]

## 2014-07-21 ENCOUNTER — Ambulatory Visit: Payer: No Typology Code available for payment source

## 2014-07-21 DIAGNOSIS — M549 Dorsalgia, unspecified: Secondary | ICD-10-CM

## 2014-07-21 NOTE — Therapy (Signed)
[place], [place], [address] Phone: [phone]   Fax:  [phone]  Physical Therapy Treatment  Patient Details  Name: Nicole Whitney MRN: [id number] Date of Birth: 05/17/1960 Referring Provider:  Nolene Ebbs, MD  Encounter Date: 07/21/2014      PT End of Session - 07/21/14 0735    Visit Number 3   Number of Visits 4   Date for PT Re-Evaluation 08/16/14   PT Start Time 0700   PT Stop Time 0750   PT Time Calculation (min) 50 min   Activity Tolerance Patient tolerated treatment well   Behavior During Therapy [facility] for tasks assessed/performed      Past Medical History  Diagnosis Date  . Stomach problems     No past surgical history on file.  There were no vitals filed for this visit.  Visit Diagnosis:  Left-sided back pain, unspecified location      Subjective Assessment - 07/21/14 0712    Symptoms She reports  in back. No  neck pain   Pain Score 5   4/10 post stretching   Pain Type Chronic pain  subacute   Pain Onset More than a month ago   Pain Frequency Intermittent   Multiple Pain Sites No                       [facility] Adult PT Treatment/Exercise - 07/21/14 0715    Lumbar Exercises: Stretches   Lower Trunk Rotation 2 reps;30 seconds  to LT with LT knee on RT knee   Lumbar Exercises: Seated   Other Seated Lumbar Exercises quadrus stretch x 2 30 sec LT and x1 RT   Other Seated Lumbar Exercises Rotation stretches x 30 sec x2 RT and Lt. Also flexion stretch x2 20 seconds.    Lumbar Exercises: Quadruped   Madcat/Old Horse 10 reps   Other Quadruped Lumbar Exercises childs pose 30 sec x 2   Moist Heat Therapy   Number Minutes Moist Heat 20 Minutes   Moist Heat Location --  back and neck                     PT Long Term Goals - 07/21/14 5701    PT LONG TERM GOAL #1   Title Pt will be I with HEP for trunk, back   Status Partially Met   PT LONG TERM  GOAL #2   Title Pt will be able to sit as needed at work with min increase in pain for 30 min at a time   PT LONG TERM GOAL #3   Title Pt will be I with principles of lifting, body mechanics to avoid re-injury.    Status Partially Met   PT LONG TERM GOAL #4   Title Pt will be able to do normal ADLs and activities in the home with min pain overall   Status On-going               Plan - 07/21/14 0739    PT Next Visit Plan review spine alignment with bending, Quadraped strength exercise        Problem List Patient Active Problem List   Diagnosis Date Noted  . Nipple discharge 12/24/2011    Darrel Hoover PT 07/21/2014, 7:40 AM  [facility] [address] [place], [place], [address] Phone: [phone]   Fax:  [phone]

## 2014-07-28 ENCOUNTER — Ambulatory Visit: Payer: No Typology Code available for payment source

## 2014-07-28 DIAGNOSIS — M549 Dorsalgia, unspecified: Secondary | ICD-10-CM

## 2014-07-28 NOTE — Patient Instructions (Signed)
Asked pt to continue HEP and to start a walking program if able.

## 2014-07-28 NOTE — Therapy (Signed)
[place], [place], [address] Phone: [phone]   Fax:  [phone]  Physical Therapy Treatment  Patient Details  Name: Nicole Whitney MRN: [id number] Date of Birth: 07-02-1960 Referring Provider:  Nolene Ebbs, MD  Encounter Date: 07/28/2014      PT End of Session - 07/28/14 0755    Visit Number 4   Number of Visits 4   PT Start Time 0700   PT Stop Time 0742   PT Time Calculation (min) 42 min   Activity Tolerance Patient tolerated treatment well      Past Medical History  Diagnosis Date  . Stomach problems     No past surgical history on file.  There were no vitals filed for this visit.  Visit Diagnosis:  Left-sided back pain, unspecified location      Subjective Assessment - 07/28/14 0717    Symptoms Mild pain today,    Currently in Pain? Yes   Pain Score --  mild   Multiple Pain Sites No            [facility] PT Assessment - 07/28/14 0719    [facility]   Cervical Flexion WNL   Cervical Extension WNL   Cervical - Right Side Bend WNL   Cervical - Left Side Bend WNL   Cervical - Right Rotation WNL   Cervical - Left Rotation WNL   Lumbar Flexion WNL   Lumbar Extension WNL   Lumbar - Right Side Bend WNL   Lumbar - Left Side Bend WNL                   [facility] Adult PT Treatment/Exercise - 07/28/14 0718    Lumbar Exercises: Stretches   Single Knee to Chest Stretch 2 reps;30 seconds  RT and LT   Lower Trunk Rotation 2 reps;30 seconds   Lumbar Exercises: Aerobic   Stationary Bike Nustep L4 UE and LE  8 min   Knee/Hip Exercises: Stretches   Active Hamstring Stretch 2 reps;30 seconds                PT Education - 07/28/14 0754    Education provided Yes   Education Details Benefits of on going exercise for pain reduction and overall finess   Person(s) Educated Patient;Spouse  interpreter   Methods Explanation   Comprehension Verbalized understanding              PT Long Term Goals - 07/28/14 0725    PT LONG TERM GOAL #1   Title (p) Pt will be I with HEP for trunk, back   Status (p) Achieved   PT LONG TERM GOAL #2   Title (p) Pt will be able to sit as needed at work with min increase in pain for 30 min at a time   Status (p) Achieved   PT LONG TERM GOAL #3   Title (p) Pt will be I with principles of lifting, body mechanics to avoid re-injury.    Status (p) Achieved   PT LONG TERM GOAL #4   Title (p) Pt will be able to do normal ADLs and activities in the home with min pain overall   Status (p) Achieved               Plan - 07/28/14 0756    Clinical Impression Statement She is subjectively improved and has less pain with work and home tasks. Mostly with prolonged standing   PT Next Visit Plan HEP  post discharge   Consulted and Agree with Plan of Care Patient;Family member/caregiver  interpreter   Family Member Consulted spouse        Problem List Patient Active Problem List   Diagnosis Date Noted  . Nipple discharge 12/24/2011    Darrel Hoover PT 07/28/2014, 7:57 AM  [facility] [address] [place], [place], [address] Phone: [phone]   Fax:  [phone]   PHYSICAL THERAPY DISCHARGE SUMMARY  Visits from Start of Care: 4  Current functional level related to goals / functional outcomes: See above   Remaining deficits: Back pain with prolonged standing   Education / Equipment: Stretching,posture, need for on going exercise Plan: Patient agrees to discharge.  Patient goals were met. Patient is being discharged due to meeting the stated rehab goals.  ?????

## 2015-05-05 ENCOUNTER — Emergency Department (HOSPITAL_COMMUNITY)
Admission: EM | Admit: 2015-05-05 | Discharge: 2015-05-05 | Disposition: A | Discharge status: Home / Self Care | Payer: Self-pay | Attending: Emergency Medicine | Admitting: Emergency Medicine

## 2015-05-05 ENCOUNTER — Encounter (HOSPITAL_COMMUNITY): Payer: Self-pay | Admitting: Neurology

## 2015-05-05 ENCOUNTER — Emergency Department (HOSPITAL_COMMUNITY): Payer: Self-pay

## 2015-05-05 ENCOUNTER — Emergency Department (HOSPITAL_COMMUNITY): Payer: No Typology Code available for payment source

## 2015-05-05 DIAGNOSIS — R110 Nausea: Secondary | ICD-10-CM | POA: Insufficient documentation

## 2015-05-05 DIAGNOSIS — R1084 Generalized abdominal pain: Secondary | ICD-10-CM | POA: Insufficient documentation

## 2015-05-05 DIAGNOSIS — Z8719 Personal history of other diseases of the digestive system: Secondary | ICD-10-CM | POA: Insufficient documentation

## 2015-05-05 DIAGNOSIS — E010 Iodine-deficiency related diffuse (endemic) goiter: Secondary | ICD-10-CM | POA: Insufficient documentation

## 2015-05-05 DIAGNOSIS — E069 Thyroiditis, unspecified: Secondary | ICD-10-CM | POA: Insufficient documentation

## 2015-05-05 LAB — URINALYSIS, ROUTINE W REFLEX MICROSCOPIC
Bilirubin Urine: NEGATIVE
GLUCOSE, UA: NEGATIVE mg/dL
Ketones, ur: NEGATIVE mg/dL
Leukocytes, UA: NEGATIVE
Nitrite: NEGATIVE
Protein, ur: NEGATIVE mg/dL
Specific Gravity, Urine: 1.015 (ref 1.005–1.030)
pH: 6.5 (ref 5.0–8.0)

## 2015-05-05 LAB — CBC
HCT: 39.5 % (ref 36.0–46.0)
Hemoglobin: 12.7 g/dL (ref 12.0–15.0)
MCH: 28.1 pg (ref 26.0–34.0)
MCHC: 32.2 g/dL (ref 30.0–36.0)
MCV: 87.4 fL (ref 78.0–100.0)
PLATELETS: 158 10*3/uL (ref 150–400)
RBC: 4.52 MIL/uL (ref 3.87–5.11)
RDW: 14.2 % (ref 11.5–15.5)
WBC: 5.2 10*3/uL (ref 4.0–10.5)

## 2015-05-05 LAB — COMPREHENSIVE METABOLIC PANEL
ALT: 23 U/L (ref 14–54)
AST: 16 U/L (ref 15–41)
Albumin: 3.1 g/dL — ABNORMAL LOW (ref 3.5–5.0)
Alkaline Phosphatase: 59 U/L (ref 38–126)
Anion gap: 11 (ref 5–15)
BUN: 8 mg/dL (ref 6–20)
CHLORIDE: 107 mmol/L (ref 101–111)
CO2: 22 mmol/L (ref 22–32)
CREATININE: 0.72 mg/dL (ref 0.44–1.00)
Calcium: 9 mg/dL (ref 8.9–10.3)
Glucose, Bld: 122 mg/dL — ABNORMAL HIGH (ref 65–99)
POTASSIUM: 3.6 mmol/L (ref 3.5–5.1)
Sodium: 140 mmol/L (ref 135–145)
Total Bilirubin: 0.5 mg/dL (ref 0.3–1.2)
Total Protein: 6.9 g/dL (ref 6.5–8.1)

## 2015-05-05 LAB — T4, FREE: Free T4: 1.21 ng/dL — ABNORMAL HIGH (ref 0.61–1.12)

## 2015-05-05 LAB — URINE MICROSCOPIC-ADD ON

## 2015-05-05 LAB — RAPID STREP SCREEN (MED CTR MEBANE ONLY): Streptococcus, Group A Screen (Direct): NEGATIVE

## 2015-05-05 LAB — TSH: TSH: 0.116 u[IU]/mL — ABNORMAL LOW (ref 0.350–4.500)

## 2015-05-05 LAB — I-STAT BETA HCG BLOOD, ED (MC, WL, AP ONLY)

## 2015-05-05 LAB — LIPASE, BLOOD: LIPASE: 24 U/L (ref 11–51)

## 2015-05-05 MED ORDER — HYDROCODONE-ACETAMINOPHEN 5-325 MG PO TABS: 1.0000 | ORAL_TABLET | Freq: Four times a day (QID) | ORAL | Status: DC | PRN

## 2015-05-05 MED ORDER — SODIUM CHLORIDE 0.9 % IV BOLUS (SEPSIS)
1000.0000 mL | Freq: Once | INTRAVENOUS | Status: AC
  Administered 2015-05-05: 1000 mL via INTRAVENOUS

## 2015-05-05 MED ORDER — IOHEXOL 300 MG/ML  SOLN
75.0000 mL | Freq: Once | INTRAMUSCULAR | Status: AC | PRN
  Administered 2015-05-05: 75 mL via INTRAVENOUS

## 2015-05-05 MED ORDER — KETOROLAC TROMETHAMINE 30 MG/ML IJ SOLN
30.0000 mg | Freq: Once | INTRAMUSCULAR | Status: AC
  Administered 2015-05-05: 30 mg via INTRAVENOUS
  Filled 2015-05-05: qty 1

## 2015-05-05 NOTE — ED Notes (Signed)
Pt. In  CT at this time.  

## 2015-05-05 NOTE — Progress Notes (Signed)
Patient is a current orange card holder and [facility]. Follow up appointment made for Jan 30,2017 @ 9:30am, pt and family at bedside verbalized understanding of the appointment. Patient educated on the [facility] orange card available resources. My contact information provided for any future questions or concerns. No other Community Health & Eligibility Specialist needs identified at this time.  Attempts to obtain an earlier appointment made to prevent admission, but unsuccessful. T.Green PA-C notified  Buddy DutyFelicia Evans [facility]  Partnership for [facility] [phone]

## 2015-05-05 NOTE — ED Notes (Signed)
Community Liason at bedside.

## 2015-05-05 NOTE — ED Provider Notes (Signed)
CSN: [id number]     Arrival date & time 05/05/15  [id number] History   First MD Initiated Contact with Patient 05/05/15 [phone]     Chief Complaint  Patient presents with  . Sore Throat  . Cough  . Abdominal Pain     (Consider location/radiation/quality/duration/timing/severity/associated sxs/prior Treatment) HPI   Patient to the ER with complaints of generalized pain all over, abdominal pain, nausea without vomiting, sore throat and neck pain. The neck pain is her worst symptoms. Her pain is primarily over the right anteriolateral side of her neck and is described as pinpoint tenderness. She can touch the exact place that it hurts and illicit severe pain. She also describes that over the past month it has become increasingly difficult to swallow and that she feels she may throw up. She has been seen before for similar complaints but was diagnosed with acid reflux and has been on Omeprazole since then but it is not helping. She denies having pain with ROM of neck.  Past Medical History  Diagnosis Date  . Stomach problems    History reviewed. No pertinent past surgical history. No family history on file. Social History  Substance Use Topics  . Smoking status: Never Smoker   . Smokeless tobacco: Never Used  . Alcohol Use: No   OB History    No data available     Review of Systems  Review of Systems All other systems negative except as documented in the HPI. All pertinent positives and negatives as reviewed in the HPI.   Allergies  Review of patient's allergies indicates no known allergies.  Home Medications   Prior to Admission medications   Medication Sig Start Date End Date Taking? Authorizing Provider  ibuprofen (ADVIL,MOTRIN) 200 MG tablet Take 200 mg by mouth every 6 (six) hours as needed. pain   Yes Historical Provider, MD  HYDROcodone-acetaminophen (NORCO/VICODIN) 5-325 MG tablet Take 1 tablet by mouth every 6 (six) hours as needed. 05/05/15   Jiya Kissinger Neva SeatGreene, PA-C  omeprazole  (PRILOSEC) 20 MG capsule Take 1 capsule (20 mg total) by mouth 2 (two) times daily. Patient not taking: Reported on 05/05/2015 11/10/11 11/09/12  Geoffery Lyonsouglas Delo, MD   BP 113/84 mmHg  Pulse 88  Temp(Src) 98.7 F (37.1 C) (Oral)  Resp 17  SpO2 98%  LMP 03/30/2015 Physical Exam  Constitutional: She appears well-developed and well-nourished. No distress.  HENT:  Head: Normocephalic and atraumatic.  Right Ear: Tympanic membrane and ear canal normal.  Left Ear: Tympanic membrane and ear canal normal.  Nose: Nose normal.  Mouth/Throat: Uvula is midline, oropharynx is clear and moist and mucous membranes are normal.  Eyes: Pupils are equal, round, and reactive to light.  Neck: Normal range of motion. Neck supple. Muscular tenderness present. No rigidity. Normal range of motion present. Thyromegaly (right anteriolateral) present.  Cardiovascular: Normal rate and regular rhythm.   Pulmonary/Chest: Effort normal.  Abdominal: Soft.  No signs of abdominal distention  Musculoskeletal:  No LE swelling  Lymphadenopathy:    She has no cervical adenopathy.  Neurological: She is alert.  Acting at baseline  Skin: Skin is warm and dry. No rash noted.  Nursing note and vitals reviewed.   ED Course  Procedures (including critical care time) Labs Review Labs Reviewed  COMPREHENSIVE METABOLIC PANEL - Abnormal; Notable for the following:    Glucose, Bld 122 (*)    Albumin 3.1 (*)    All other components within normal limits  URINALYSIS, ROUTINE W REFLEX MICROSCOPIC (NOT AT  [facility]) - Abnormal; Notable for the following:    Hgb urine dipstick MODERATE (*)    All other components within normal limits  URINE MICROSCOPIC-ADD ON - Abnormal; Notable for the following:    Squamous Epithelial / LPF 0-5 (*)    Bacteria, UA RARE (*)    All other components within normal limits  RAPID STREP SCREEN (NOT AT [facility])  CULTURE, GROUP A STREP  LIPASE, BLOOD  CBC  T3  T3 UPTAKE  T3, FREE  T4  T4, FREE  TSH   I-STAT BETA HCG BLOOD, ED ([facility], [facility], [facility] ONLY)    Imaging Review Ct Soft Tissue Neck W Contrast  05/05/2015  CLINICAL DATA:  Pain to the frontal lateral right neck with difficulty swallowing. EXAM: CT NECK WITH CONTRAST TECHNIQUE: Multidetector CT imaging of the neck was performed using the standard protocol following the bolus administration of intravenous contrast. CONTRAST:  75mL OMNIPAQUE IOHEXOL 300 MG/ML  SOLN COMPARISON:  None. FINDINGS: Pharynx and larynx: Patent. Symmetric prominence of the palatini tonsils without suspected tonsillitis. Tonsilliths present on the right. No submucosal edema identified. Where not collapsed did the epiglottis has normal thickness. Salivary glands: Normal Thyroid: Prominent volume with isthmic diameter of 12 mm. The thyroid has heterogeneous enhancement with edema around the right more than left lobes. No indication of abscess. Lymph nodes: No enlarged or necrotic appearing lymph nodes Vascular: Patent Limited intracranial: Negative Visualized orbits: Negative Mastoids and visualized paranasal sinuses: Clear Skeleton: Negative Upper chest: Clear apical lungs IMPRESSION: Central neck inflammation compatible with thyroiditis. Correlate with thyroid function studies. Electronically Signed   By: Marnee Spring M.D.   On: 05/05/2015 14:03   Dg Abd Acute W/chest  05/05/2015  CLINICAL DATA:  Lower left abdominal pain EXAM: DG ABDOMEN ACUTE W/ 1V CHEST COMPARISON:  06/08/2014 FINDINGS: Bandlike atelectasis at the bases. There is no edema, consolidation, effusion, or pneumothorax. Normal heart size and mediastinal contours. Nonobstructive bowel gas pattern. No abnormal stool retention. No concerning intra-abdominal mass effect or calcification. No pneumoperitoneum. IMPRESSION: 1. Negative abdominal radiographs. 2. Bibasilar atelectasis. Electronically Signed   By: Marnee Spring M.D.   On: 05/05/2015 10:05   I have personally reviewed and evaluated these images and lab results  as part of my medical decision-making.   EKG Interpretation None      MDM   Final diagnoses:  Thyroiditis    She has normal urinalysis, Lipase and CMP are unremarkable. As well as CBC and strep screen. Norma acute abd w/chest xray. She has tenderness on her neck that is pinpoint with dysphagia. Soft tiss CT of the neck with contrast shows thyroid inflammation, thyroiditis. I spokewith Case Management about getting her appointment moved up sooner but there are no earlier appointment. I spoke with Gunnar Fusi with [facility] for a consult as to if patient necessitates admission and her attending Dr. Shelly Flatten did not feel it was necessary but that we could consult ENT if were were still unsure. Pts has dysphagia but is able to swallow, is not vomiting and food is not getting stuck.   Thyroid panel drawn before discharge. VERY strict return precautions given.     Marlon Pel, PA-C 05/05/15 1519  Lavera Guise, MD 05/06/15 [phone]

## 2015-05-05 NOTE — Discharge Instructions (Signed)

## 2015-05-05 NOTE — ED Notes (Signed)
Pt reports for 1 month has had sore throat, cough, also generalized abd pain. Feeling nauseated but no v/d.

## 2015-05-06 LAB — T3, FREE: T3 FREE: 4.3 pg/mL (ref 2.0–4.4)

## 2015-05-06 LAB — T4: T4, Total: 9.2 ug/dL (ref 4.5–12.0)

## 2015-05-06 LAB — T3 UPTAKE: T3 Uptake Ratio: 30 % (ref 24–39)

## 2015-05-06 LAB — T3: T3, Total: 193 ng/dL — ABNORMAL HIGH (ref 71–180)

## 2015-05-07 LAB — CULTURE, GROUP A STREP: Strep A Culture: NEGATIVE

## 2015-05-15 ENCOUNTER — Emergency Department (HOSPITAL_COMMUNITY): Payer: No Typology Code available for payment source

## 2015-05-15 ENCOUNTER — Emergency Department (HOSPITAL_COMMUNITY)
Admission: EM | Admit: 2015-05-15 | Discharge: 2015-05-15 | Disposition: A | Discharge status: Home / Self Care | Payer: No Typology Code available for payment source | Attending: Emergency Medicine | Admitting: Emergency Medicine

## 2015-05-15 DIAGNOSIS — Z8719 Personal history of other diseases of the digestive system: Secondary | ICD-10-CM | POA: Insufficient documentation

## 2015-05-15 DIAGNOSIS — E061 Subacute thyroiditis: Secondary | ICD-10-CM

## 2015-05-15 LAB — COMPREHENSIVE METABOLIC PANEL
ALK PHOS: 64 U/L (ref 38–126)
ALT: 24 U/L (ref 14–54)
ANION GAP: 8 (ref 5–15)
AST: 22 U/L (ref 15–41)
Albumin: 3.2 g/dL — ABNORMAL LOW (ref 3.5–5.0)
BUN: 12 mg/dL (ref 6–20)
CALCIUM: 9.4 mg/dL (ref 8.9–10.3)
CO2: 25 mmol/L (ref 22–32)
CREATININE: 0.72 mg/dL (ref 0.44–1.00)
Chloride: 108 mmol/L (ref 101–111)
Glucose, Bld: 106 mg/dL — ABNORMAL HIGH (ref 65–99)
Potassium: 3.8 mmol/L (ref 3.5–5.1)
SODIUM: 141 mmol/L (ref 135–145)
TOTAL PROTEIN: 7.6 g/dL (ref 6.5–8.1)
Total Bilirubin: 0.3 mg/dL (ref 0.3–1.2)

## 2015-05-15 LAB — CBC
HCT: 39.4 % (ref 36.0–46.0)
HEMOGLOBIN: 13.1 g/dL (ref 12.0–15.0)
MCH: 28.9 pg (ref 26.0–34.0)
MCHC: 33.2 g/dL (ref 30.0–36.0)
MCV: 86.8 fL (ref 78.0–100.0)
PLATELETS: 198 10*3/uL (ref 150–400)
RBC: 4.54 MIL/uL (ref 3.87–5.11)
RDW: 13.7 % (ref 11.5–15.5)
WBC: 4.6 10*3/uL (ref 4.0–10.5)

## 2015-05-15 LAB — URINALYSIS, ROUTINE W REFLEX MICROSCOPIC
Bilirubin Urine: NEGATIVE
Glucose, UA: NEGATIVE mg/dL
Ketones, ur: NEGATIVE mg/dL
LEUKOCYTES UA: NEGATIVE
NITRITE: NEGATIVE
PROTEIN: NEGATIVE mg/dL
SPECIFIC GRAVITY, URINE: 1.01 (ref 1.005–1.030)
pH: 5.5 (ref 5.0–8.0)

## 2015-05-15 LAB — URINE MICROSCOPIC-ADD ON

## 2015-05-15 LAB — LIPASE, BLOOD: Lipase: 28 U/L (ref 11–51)

## 2015-05-15 MED ORDER — NAPROXEN 500 MG PO TABS: 500.0000 mg | ORAL_TABLET | Freq: Two times a day (BID) | ORAL | Status: AC

## 2015-05-15 MED ORDER — HYDROCODONE-ACETAMINOPHEN 5-325 MG PO TABS: 1.0000 | ORAL_TABLET | Freq: Four times a day (QID) | ORAL | Status: AC | PRN

## 2015-05-15 MED ORDER — PREDNISONE 20 MG PO TABS: ORAL_TABLET | ORAL | Status: AC

## 2015-05-15 MED ORDER — IBUPROFEN 800 MG PO TABS
800.0000 mg | ORAL_TABLET | Freq: Once | ORAL | Status: AC
  Administered 2015-05-15: 800 mg via ORAL
  Filled 2015-05-15: qty 1

## 2015-05-15 NOTE — ED Provider Notes (Signed)
CSN: [id number]     Arrival date & time 05/15/15  1301 History   First MD Initiated Contact with Patient 05/15/15 1501     Chief Complaint  Patient presents with  . Sore Throat  . Cough     (Consider location/radiation/quality/duration/timing/severity/associated sxs/prior Treatment) HPI   55 year old female presents c/o neck pain.  History obtained through family member who is at bedside. For the past 2 months patient has had persistent right neck pain. Pain is described as a soreness, tender to palpation, accompanied with cough productive with white sputum. Report sputum is bitter in taste. Pain is 7/10. Endorsed some posttussive emesis yesterday. Pain occasionally improved with taking ibuprofen. She endorse generalized weakness and fatigue, temperature sensitivity, and she is concerned about her symptoms. She denies having fever, chills, runny nose, sneezing, ear pain, trouble swallowing, heart palpitation, shortness of breath, chest pain, abdominal pain, abnormal weight changes, tremors, or rash. [place] 7 [place]. She was seen in the ED on 05/05/2015 or complaining of sore throat. She was diagnosed with thyroiditis based on soft tissue neck. Thyroid panel was obtained at that time but have not result on patient discharge.  Thyroid panel as follow (T3 193, T4 normal, Free T4 1.21, TSH 0.116) which indicates hyperthydroidism.  Patient reported her upcoming appointment is on January 30 but she is here due to her persistent neck pain. No prior history of thyroid disease.    Past Medical History  Diagnosis Date  . Stomach problems    No past surgical history on file. No family history on file. Social History  Substance Use Topics  . Smoking status: Never Smoker   . Smokeless tobacco: Never Used  . Alcohol Use: No   OB History    No data available     Review of Systems  All other systems reviewed and are negative.     Allergies  Review of patient's  allergies indicates no known allergies.  Home Medications   Prior to Admission medications   Medication Sig Start Date End Date Taking? Authorizing Provider  HYDROcodone-acetaminophen (NORCO/VICODIN) 5-325 MG tablet Take 1 tablet by mouth every 6 (six) hours as needed. 05/05/15  Yes Tiffany Neva Seat, PA-C  ibuprofen (ADVIL,MOTRIN) 200 MG tablet Take 200 mg by mouth every 6 (six) hours as needed. pain   Yes Historical Provider, MD   BP 118/84 mmHg  Pulse 94  Temp(Src) 98.1 F (36.7 C) (Oral)  Resp 14  Ht 5\' 4"  (1.626 m)  Wt 81.846 kg  BMI 30.96 kg/m2  SpO2 96%  LMP 03/30/2015 Physical Exam  Constitutional: She is oriented to person, place, and time. She appears well-developed and well-nourished. No distress.  African-American female mildly obese, in no acute discomfort.  HENT:  Head: Atraumatic.  Mouth/Throat: Oropharynx is clear and moist. No oropharyngeal exudate.  Throat: Uvula is midline, no tonsillar enlargement or exudates, no trismus  Eyes: Conjunctivae are normal.  No exopthalmos  Neck: Normal range of motion. Neck supple. No JVD present. No tracheal deviation present. Thyromegaly present.  Evidence of a goiter, along with tenderness to the right anterior base of neck  Cardiovascular: Normal rate and regular rhythm.  Exam reveals no gallop and no friction rub.   No murmur heard. Pulmonary/Chest: Breath sounds normal. No stridor.  Abdominal: Soft. Bowel sounds are normal. She exhibits no distension. There is no tenderness.  Musculoskeletal: She exhibits no edema.  Lymphadenopathy:    She has no cervical adenopathy.  Neurological: She  is alert and oriented to person, place, and time.  Skin: No rash noted.  Psychiatric: She has a normal mood and affect.  Nursing note and vitals reviewed.   ED Course  Procedures (including critical care time) Labs Review Labs Reviewed  COMPREHENSIVE METABOLIC PANEL - Abnormal; Notable for the following:    Glucose, Bld 106 (*)     Albumin 3.2 (*)    All other components within normal limits  URINALYSIS, ROUTINE W REFLEX MICROSCOPIC (NOT AT [facility]) - Abnormal; Notable for the following:    Hgb urine dipstick SMALL (*)    All other components within normal limits  URINE MICROSCOPIC-ADD ON - Abnormal; Notable for the following:    Squamous Epithelial / LPF 0-5 (*)    Bacteria, UA RARE (*)    All other components within normal limits  LIPASE, BLOOD  CBC    Imaging Review Dg Chest 2 View  05/15/2015  CLINICAL DATA:  Two day history of cough EXAM: CHEST  2 VIEW COMPARISON:  May 05, 2015 FINDINGS: There has been significant clearing of atelectasis from the lung bases compared to recent prior study. There is currently minimal bibasilar atelectasis. Lungs elsewhere clear. Heart size and pulmonary vascularity are normal. No adenopathy. On the lateral view, there is a safety pin which presumably overlies the posterior abdomen. IMPRESSION: Minimal bibasilar atelectasis.  No edema or consolidation. Electronically Signed   By: Bretta BangWilliam  Woodruff III M.D.   On: 05/15/2015 16:26   I have personally reviewed and evaluated these images and lab results as part of my medical decision-making.   EKG Interpretation None      MDM   Final diagnoses:  Thyroiditis, subacute    BP 107/75 mmHg  Pulse 79  Temp(Src) 98.1 F (36.7 C) (Oral)  Resp 14  Ht 5\' 4"  (1.626 m)  Wt 81.846 kg  BMI 30.96 kg/m2  SpO2 97%  LMP 04/27/2015   3:24 PM Patient presents with persistent neck pain, not sore throat. She was seen 1/5 and was diagnosed with thyroiditis. Her thyroid panel is suggestive of hyperthyrodism (Graves disease)  vs. Thyroiditis (subacute, de Quervain's, Hashimotos, etc...). Pt currently with stable normal vital sign.  No evidence to suggest high output cardiac failure, thyrotoxicosis or other emergent medical condition.  She is able to swallow without difficulty and has normal phonation.  No abd pain on exam.  Her labs are  reassuring.  Doubt URI sxs or pharyngitis.  Cough and bitter sputum may suggest acid reflux.  However, in the setting of abnormal thyroid panel pt should be evaluated further for thyroid disease.  Care discussed with Dr. Ethelda ChickJacubowitz.     Pt discharge with pain medication, NSAIDs, a short course of steroid and recommend following up with her doctor as previously scheduled.  She may need f/u with endocrinologist for more thorough assessment.  Otherwise pt is stable for discharge.     Fayrene HelperBowie Kasin Tonkinson, PA-C 05/16/15 0019  Doug SouSam Jacubowitz, MD 05/18/15 [phone]

## 2015-05-15 NOTE — ED Notes (Signed)
Pt c/o sore throat & cough with white sputum onset x 2 mths, pt seen here for similar symptoms 05/05/2015 with dx of thyroiditis, pt reports onset of nausea & vomiting yesterday with x 3 vomiting episodes yesterday,  denies CP,diarrhea, & SOB, pt c/o frontal throat pain

## 2015-05-15 NOTE — ED Provider Notes (Signed)
Patient speaks no [place]. She speaks Swahili history is obtained using a professional medical interpreter using [employer] language line Complains of sore throat with pain on swallowing for the past 2.5 months she also complains of nonproductive cough for the past 2 days. She presents today as she ran out of the pain medicine prescribed for the last visit He underwent CT scan of the soft tissues of the neck on 05/04/2014 consistent with thyroiditis. On exam she is alert and nontoxic. Handling secretions well. Oropharynx is normal lungs clear to auscultation, coughing occasiomally . I see no signs of thyrotoxicosis. Plan prescriptions Norco, ibuprofen. Keep scheduled appointment for[facility]/30/[facility] Chest x-ray viewed by me  Doug SouSam Anden Bartolo, MD 05/15/15 1749

## 2015-05-15 NOTE — Discharge Instructions (Signed)
Your neck pain is likely due to having thyroiditis which is inflammation of your thyroid gland.  Please follow up with your doctor and with a endocrinologist for further management of your condition.  Take naproxen as needed for pain.  Take steroid as prescribed for the next few days.  Return if your condition worsen or if you have other concerns.    What is thyroiditis? Thyroiditis refers to several disorders that cause an inflammation of the thyroid, a gland located in the front of your neck below your Adams apple. The thyroid makes hormones that control metabolism, the pace of your bodys processes. Metabolism includes things like your heart rate and how quickly you burn calories.  What are the different types of thyroiditis and how do they affect the body? There are several types of thyroiditis.  Hashimotos thyroiditis Caused by antibodies that attack the thyroid. Shows symptoms of hypothyroidism. Results in permanent hypothyroidism, which can be treated.  Subacute thyroiditis (also called de Quervains thyroiditis) Possibly caused by a viral infection. Causes pain in the thyroid and symptoms of hyperthyroidism, followed by hypothyroidism. Symptoms improve within a few months. There is a slight chance of permanent hypothyroidism, which can be treated.  Silent thyroiditis Shows symptoms of hyperthyroidism, followed by hypothyroidism. Symptoms improve within 12 to 18 months. May result in permanent hypothyroidism.  Postpartum thyroiditis Caused by antibodies that attack the thyroid after delivery of a child. Four to 6 months after delivery, symptoms of hyperthyroidism appear, followed by hypothyroidism. Symptoms improve within 12 to 18 months. May result in permanent hypothyroidism.  Drug-induced thyroiditis Caused by prescription drugs such as amiodarone, lithium, interferons and cytokines. Shows symptoms of hyperthyroidism or hypothyroidism. Symptoms continue as long as the drug  is taken.  Radiation-induced thyroiditis Follows treatment with radioactive iodine for hyperthyroidism or radiation therapy for certain cancers. Most commonly shows symptoms of hypothyroidism. Hypothyroidism is usually permanent, but can be treated.  Acute thyroiditis (also called suppurative thyroiditis) Caused by bacteria or other infectious organisms. Symptoms include a painful thyroid, generalized illness and occasionally symptoms of mild hypothyroidism. Symptoms improve after treatment of the infectious cause.  Symptoms What are the symptoms of thyroiditis? Since thyroiditis refers to a group of disorders rather than just one disorder, the symptoms vary.  Thyroiditis can cause slow, long-term thyroid cell damage and destruction that causes thyroid hormone levels in the blood to fall. If so, the symptoms are like those of hypothyroidism (underactive thyroid). Symptoms include the following:  Thyroiditis can cause rapid thyroid cell damage and destruction that causes thyroid hormone in the gland to leak out and increase the thyroid hormone levels in your blood. If so, it causes symptoms that are like those of hyperthyroidism (overactive thyroid). Symptoms include the following:  People who have thyroiditis can sometimes have pain in the thyroid gland (in the front of the neck).  Fatigue Unexpected weight gain Constipation Dry skin Depression Muscle aches Weight loss Nervousness, anxiety or irritability Difficulty sleeping Rapid heart rate Fatigue Muscle weakness Tremors (shaking hands or fingers) Causes & Risk Factors What causes thyroiditis? Thyroiditis is caused by an attack on the thyroid gland. The attack causes inflammation (the bodys response to injury) and damages the thyroid cells. Usually, the attack on the thyroid is due to an autoimmune disease. Normally, antibodies produced by the bodys immune system help protect the body against viruses, bacteria and other  foreign substances. An autoimmune disease is when your immune system produces antibodies that attack your bodys tissues and/or organs.  Thyroiditis  can also be caused by an infection or certain medicines.  Diagnosis & Tests How will my doctor know if I have thyroiditis? Your doctor will perform laboratory tests to determine if you have thyroiditis, and, if so, what type of thyroiditis you have. Blood tests measure the amount of thyroid hormone in your blood and can indicate whether your thyroid is releasing too much hormone or too little. Blood tests can also show how much thyroid-stimulating hormone your pituitary gland is producing and what antibodies are present in the body.  Your doctor may also do a radioactive iodine uptake test to measure your thyroids ability to take up iodine, a mineral that is needed to produce thyroid hormone. In some cases, a biopsy may be needed to determine what is attacking the thyroid.  Treatment How is thyroiditis treated? Your treatment depends on what type of thyroiditis you have and what symptoms you are experiencing.  If you have symptoms of hyperthyroidism, your doctor may prescribe a medicine called a beta blocker to lower your heart rate and reduce any tremors you may be experiencing. Since the symptoms of hyperthyroidism may be temporary, your doctor may taper the dose of this medicine as your symptoms improve.  If you have symptoms of hypothyroidism, your doctor may prescribe thyroid hormone replacements to restore your bodys hormone levels and shift your metabolism back to normal. It can take several tries to get the right dose of synthetic thyroid hormone. In some types of thyroiditis, the symptoms of hypothyroidism will improve over time and your doctor will slowly taper your dose of synthetic thyroid hormone.  If you have pain in your thyroid, your doctor may recommend a mild anti-inflammatory medication like aspirin or ibuprofen (one brand name:  Motrin) to manage the pain. Occasionally, severe thyroid pain requires treatment with steroid therapy.  Questions to Ask Your Doctor What kind of thyroiditis do I have? What is the likely cause of my thyroiditis? What are the results of my blood test(s)? What do these results mean? Will I need to take medicine? For how long? Am I at risk for any long-term health problems?

## 2015-05-30 ENCOUNTER — Ambulatory Visit (INDEPENDENT_AMBULATORY_CARE_PROVIDER_SITE_OTHER): Payer: No Typology Code available for payment source | Admitting: Family Medicine

## 2015-05-30 ENCOUNTER — Encounter: Payer: Self-pay | Admitting: Family Medicine

## 2015-05-30 VITALS — BP 98/79 | HR 83 | Temp 98.0°F | Ht 63.0 in | Wt 180.0 lb

## 2015-05-30 DIAGNOSIS — M542 Cervicalgia: Secondary | ICD-10-CM

## 2015-05-30 DIAGNOSIS — R197 Diarrhea, unspecified: Secondary | ICD-10-CM

## 2015-05-30 DIAGNOSIS — E069 Thyroiditis, unspecified: Secondary | ICD-10-CM

## 2015-05-30 LAB — T3: T3, Total: 83.5 ng/dL (ref 80.0–204.0)

## 2015-05-30 LAB — T4, FREE: Free T4: 0.86 ng/dL (ref 0.80–1.80)

## 2015-05-30 LAB — LIPID PANEL
Cholesterol: 192 mg/dL (ref 125–200)
HDL: 33 mg/dL — AB (ref >=46)
LDL CALC: 126 mg/dL (ref <130)
TRIGLYCERIDES: 165 mg/dL — AB (ref <150)
Total CHOL/HDL Ratio: 5.8 Ratio — ABNORMAL HIGH (ref <=5.0)
VLDL: 33 mg/dL — AB (ref <30)

## 2015-05-30 LAB — TSH: TSH: 9.015 u[IU]/mL — ABNORMAL HIGH (ref 0.350–4.500)

## 2015-05-30 MED ORDER — METRONIDAZOLE 500 MG PO TABS: 500.0000 mg | ORAL_TABLET | Freq: Three times a day (TID) | ORAL | Status: AC

## 2015-05-30 NOTE — Patient Instructions (Signed)
Take antibiotic for 7 days. Follow-up if this does not resolve pain and diarrhea.  Otherwise follow-up in one month to recheck thyroid I am going to try to refer to specialist about neck pain.

## 2015-05-30 NOTE — Progress Notes (Signed)
Patient ID: Nicole Whitney, female   DOB: [birth year], 55 y.o.   MRN: [id number]   Nicole Whitney, is a 55 y.o. female  WUJ:[id number]  [id number]:[id number]  DOB - January 18, 1961  CC:  Chief Complaint  Patient presents with  . Diarrhea    started diarrhea on Friday with lower abd pain, also was seen in ER for anterior nect pain which persists, and she needs to get established       HPI: Nicole Whitney is a 55 y.o. female here to establish care. With a review, I find that she is not new, is in our system under the name Nicole Whitney. I saw her has in July 2016. She has been seen twice in ED recently for neck/throat pain. On that visit she was diagnoses with thyroiditis and the pain was recorded as being anteriolaterial. Today she indicates the pain and soreness is not in the area of the thyroid gland but is at the angle of the jaw in the tonsillar area. Her TSH was 0.116, Free t4 1.21, T3t total 193. In July of 2016 her TSH was 1.147. A CT scan of neck showed the tail of the isthmus at 12 mm. Her other complaint today is of continued diarrhea.  A review of stool studies done under the name of Nicole Whitney shows Endolimax nana cysts. She is need of a PAP and mammogram. She declines influenza  No Known Allergies Past Medical History  Diagnosis Date  . Stomach problems    Current Outpatient Prescriptions on File Prior to Visit  Medication Sig Dispense Refill  . HYDROcodone-acetaminophen (NORCO/VICODIN) 5-325 MG tablet Take 1 tablet by mouth every 6 (six) hours as needed. 20 tablet 0  . predniSONE (DELTASONE) 20 MG tablet 3 tabs po day one, then 2 po daily x 4 days 11 tablet 0  . ibuprofen (ADVIL,MOTRIN) 200 MG tablet Take 200 mg by mouth every 6 (six) hours as needed. Reported on 05/30/2015    . naproxen (NAPROSYN) 500 MG tablet Take 1 tablet (500 mg total) by mouth 2 (two) times daily. (Patient not taking: Reported on 05/30/2015) 30 tablet 0   No current facility-administered medications on file  prior to visit.   History reviewed. No pertinent family history. Social History   Social History  . Marital Status: Married    Spouse Name: N/A  . Number of Children: N/A  . Years of Education: N/A   Occupational History  . Not on file.   Social History Main Topics  . Smoking status: Never Smoker   . Smokeless tobacco: Never Used  . Alcohol Use: No  . Drug Use: No  . Sexual Activity: Not on file   Other Topics Concern  . Not on file   Social History Narrative    Review of Systems: Constitutional: Negative for fever, chills, appetite change, weight loss,  Fatigue. Skin: Negative for rashes or lesions of concern. HENT: Negative for ear pain, ear discharge.nose bleeds Eyes: Negative for pain, discharge, redness, itching and visual disturbance. Neck: Negative for pain, stiffness Respiratory: Negative for cough, shortness of breath,   Cardiovascular: Negative for chest pain, palpitations and leg swelling. Gastrointestinal: Negative for abdominal pain, nausea, vomiting, diarrhea, constipations Genitourinary: Negative for dysuria, urgency, frequency, hematuria,  Musculoskeletal: Negative for back pain, joint pain, joint  swelling, and gait problem.Negative for weakness. Neurological: Negative for dizziness, tremors, seizures, syncope,   light-headedness, numbness and headaches.  Hematological: Negative for easy bruising or bleeding Psychiatric/Behavioral: Negative for depression, anxiety, decreased concentration, confusion  Objective:   Filed Vitals:   05/30/15 1015  BP: 98/79  Pulse: 83  Temp: 98 F (36.7 C)    Physical Exam: Constitutional: Patient appears well-developed and well-nourished. No distress. HENT: Normocephalic, atraumatic, External right and left ear normal. Oropharynx is clear and moist.  Eyes: Conjunctivae and EOM are normal. PERRLA, no scleral icterus. Neck: Normal ROM. Neck supple. No lymphadenopathy, No thyromegaly. CVS: RRR, S1/S2 +, no  murmurs, no gallops, no rubs Pulmonary: Effort and breath sounds normal, no stridor, rhonchi, wheezes, rales.  Abdominal: Soft. Normoactive BS,, no distension, tenderness, rebound or guarding.  Musculoskeletal: Normal range of motion. No edema and no tenderness.  Neuro: Alert.Normal muscle tone coordination. Non-focal Skin: Skin is warm and dry. No rash noted. Not diaphoretic. No erythema. No pallor. Psychiatric: Normal mood and affect. Behavior, judgment, thought content normal.  Lab Results  Component Value Date   WBC 4.6 05/15/2015   HGB 13.1 05/15/2015   HCT 39.4 05/15/2015   MCV 86.8 05/15/2015   PLT 198 05/15/2015   Lab Results  Component Value Date   CREATININE 0.72 05/15/2015   BUN 12 05/15/2015   NA 141 05/15/2015   K 3.8 05/15/2015   CL 108 05/15/2015   CO2 25 05/15/2015    No results found for: HGBA1C Lipid Panel  No results found for: CHOL, TRIG, HDL, CHOLHDL, VLDL, LDLCALC     Assessment and plan:   1. Thyroiditis  - TSH - T3 - T4, free - Lipid panel -Will consider next step once we have lab results.   2. Diarrhea, unspecified type - Flagyl 500 mg, #21, one po tid x 7 days.  3 Pain, right neck/throat for 3 months. - will attempt referral to ENT  4. Need for colon cancer screen -Provided stool cards for home collection  5. Need for mammogram  - MM DIGITAL SCREENING BILATERAL; Future  6. Need for PAP -Requested a follow-up visit for PAP  Return in about 1 month (around 06/28/2015).     The patient was given clear instructions to go to ER or return to medical center if symptoms don't improve, worsen or new problems develop. The patient verbalized understanding.    Henrietta Hoover FNP  05/30/2015, 11:25 AM

## 2015-06-02 ENCOUNTER — Other Ambulatory Visit: Payer: Self-pay | Admitting: Family Medicine

## 2015-06-02 DIAGNOSIS — Z1231 Encounter for screening mammogram for malignant neoplasm of breast: Secondary | ICD-10-CM

## 2015-07-01 ENCOUNTER — Other Ambulatory Visit: Payer: Self-pay

## 2015-07-11 ENCOUNTER — Ambulatory Visit: Payer: Self-pay | Admitting: Family Medicine

## 2016-04-09 IMAGING — CT CT NECK W/ CM
1 of 5 series · 4 of 33 positions shown, 5 images · IV contrast (omnipaque)
Comparison: None.

CLINICAL DATA: Pain to the frontal lateral right neck with
difficulty swallowing.

EXAM:
CT NECK WITH CONTRAST
TECHNIQUE: Multidetector CT imaging of the neck was performed using the
standard protocol following the bolus administration of intravenous
contrast.
CONTRAST:  75mL OMNIPAQUE IOHEXOL 300 MG/ML  SOLN

[Series 206: orthogonals · axial · 0.49mm/px · z∈[-0,+140]mm · 4 of 119 slices shown, 5 images]
[im 24/119  soft-tissue]
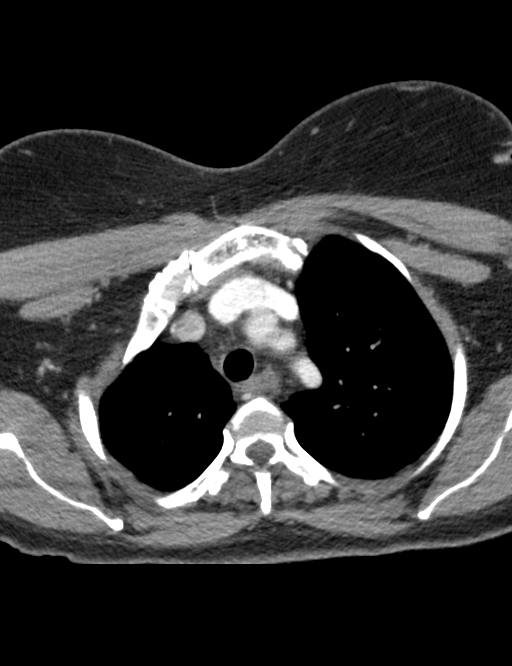
[im 24/119  bone]
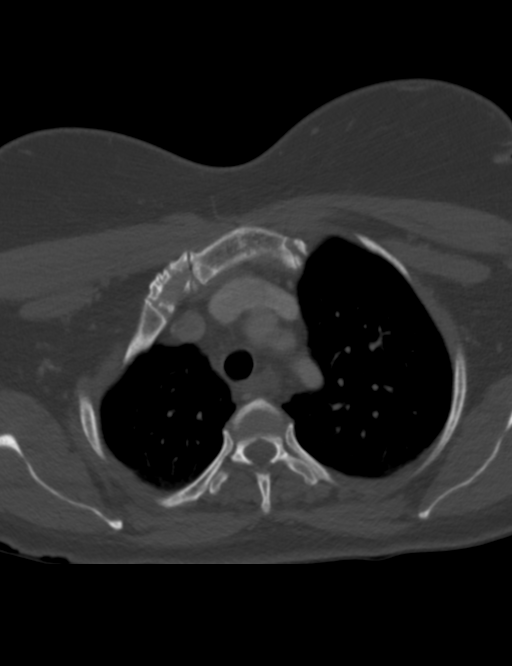
[im 48/119  bone]
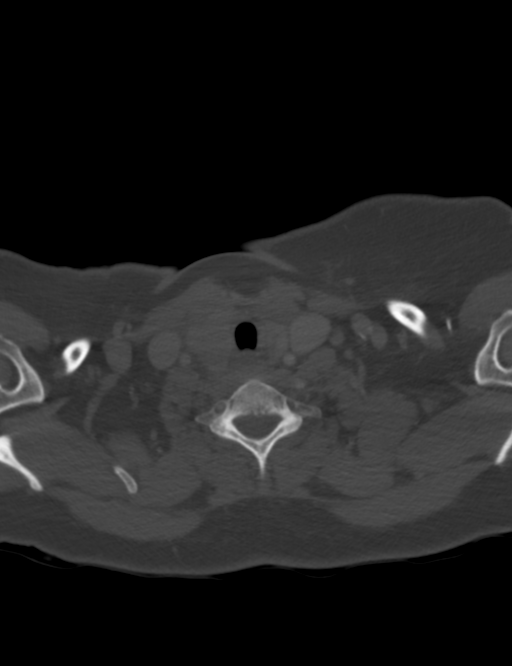
[im 71/119  bone]
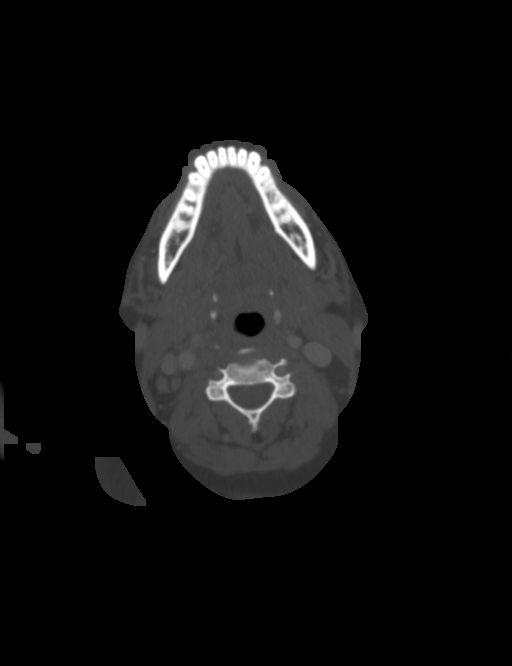
[im 95/119  bone]
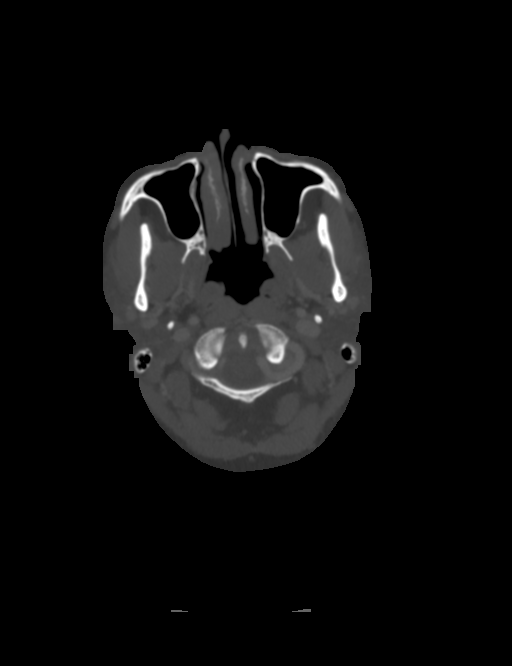

[4 of 33 positions shown; findings below may reference images not displayed]

FINDINGS: Pharynx and larynx: Patent. Symmetric prominence of the palatini
tonsils without suspected tonsillitis. Tonsilliths present on the
right. No submucosal edema identified. Where not collapsed did the
epiglottis has normal thickness.

Salivary glands: Normal

Thyroid: Prominent volume with isthmic diameter of 12 mm. The
thyroid has heterogeneous enhancement with edema around the right
more than left lobes. No indication of abscess.

Lymph nodes: No enlarged or necrotic appearing lymph nodes

Vascular: Patent

Limited intracranial: Negative

Visualized orbits: Negative

Mastoids and visualized paranasal sinuses: Clear

Skeleton: Negative

Upper chest: Clear apical lungs
IMPRESSION: Central neck inflammation compatible with thyroiditis. Correlate
with thyroid function studies.
